# Patient Record
Sex: Female | Born: 1980 | Race: White | Hispanic: No | Smoking: Current every day smoker
Health system: Southern US, Community
[De-identification: ages and names within clinical notes are randomized; demographics above are authoritative.]

## PROBLEM LIST (undated history)

## (undated) ENCOUNTER — Inpatient Hospital Stay (HOSPITAL_COMMUNITY): Payer: Self-pay

## (undated) DIAGNOSIS — F1110 Opioid abuse, uncomplicated: Secondary | ICD-10-CM

## (undated) DIAGNOSIS — R87629 Unspecified abnormal cytological findings in specimens from vagina: Secondary | ICD-10-CM

## (undated) HISTORY — DX: Unspecified abnormal cytological findings in specimens from vagina: R87.629

---
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
## 2002-09-07 ENCOUNTER — Other Ambulatory Visit: Admission: RE | Admit: 2002-09-07 | Discharge: 2002-09-07 | Payer: Self-pay | Admitting: Family Medicine

## 2002-09-07 ENCOUNTER — Encounter: Payer: Self-pay | Admitting: Family Medicine

## 2002-09-07 ENCOUNTER — Ambulatory Visit (HOSPITAL_COMMUNITY): Admission: RE | Admit: 2002-09-07 | Discharge: 2002-09-07 | Payer: Self-pay | Admitting: Family Medicine

## 2002-10-23 ENCOUNTER — Ambulatory Visit (HOSPITAL_COMMUNITY): Admission: RE | Admit: 2002-10-23 | Discharge: 2002-10-23 | Payer: Self-pay | Admitting: Family Medicine

## 2002-10-26 ENCOUNTER — Encounter: Payer: Self-pay | Admitting: Family Medicine

## 2003-01-26 ENCOUNTER — Ambulatory Visit (HOSPITAL_COMMUNITY): Admission: RE | Admit: 2003-01-26 | Discharge: 2003-01-26 | Payer: Self-pay | Admitting: Family Medicine

## 2003-01-26 ENCOUNTER — Encounter: Payer: Self-pay | Admitting: Family Medicine

## 2003-03-20 ENCOUNTER — Inpatient Hospital Stay (HOSPITAL_COMMUNITY): Admission: AD | Admit: 2003-03-20 | Discharge: 2003-03-22 | Payer: Self-pay | Admitting: *Deleted

## 2003-12-24 ENCOUNTER — Other Ambulatory Visit: Admission: RE | Admit: 2003-12-24 | Discharge: 2003-12-24 | Payer: Self-pay | Admitting: Family Medicine

## 2016-03-07 ENCOUNTER — Encounter (HOSPITAL_COMMUNITY): Payer: Self-pay | Admitting: *Deleted

## 2016-03-07 ENCOUNTER — Emergency Department (HOSPITAL_COMMUNITY)
Admission: EM | Admit: 2016-03-07 | Discharge: 2016-03-07 | Disposition: A | Discharge status: Home / Self Care | Payer: Self-pay | Attending: Dermatology | Admitting: Dermatology

## 2016-03-07 DIAGNOSIS — Y999 Unspecified external cause status: Secondary | ICD-10-CM | POA: Insufficient documentation

## 2016-03-07 DIAGNOSIS — S90861A Insect bite (nonvenomous), right foot, initial encounter: Secondary | ICD-10-CM | POA: Insufficient documentation

## 2016-03-07 DIAGNOSIS — Y929 Unspecified place or not applicable: Secondary | ICD-10-CM | POA: Insufficient documentation

## 2016-03-07 DIAGNOSIS — F17210 Nicotine dependence, cigarettes, uncomplicated: Secondary | ICD-10-CM | POA: Insufficient documentation

## 2016-03-07 DIAGNOSIS — Y939 Activity, unspecified: Secondary | ICD-10-CM | POA: Insufficient documentation

## 2016-03-07 DIAGNOSIS — Z5321 Procedure and treatment not carried out due to patient leaving prior to being seen by health care provider: Secondary | ICD-10-CM | POA: Insufficient documentation

## 2016-03-07 DIAGNOSIS — W57XXXA Bitten or stung by nonvenomous insect and other nonvenomous arthropods, initial encounter: Secondary | ICD-10-CM | POA: Insufficient documentation

## 2016-03-07 NOTE — ED Notes (Addendum)
Pt comes in with redness, swelling, and warmth noted to her left foot. Area is noted to have a blister on it as well. Pt states she had a "black bug" bite her on Thursday.

## 2016-03-07 NOTE — ED Notes (Signed)
Pt told registration she was leaving.  

## 2018-06-07 ENCOUNTER — Other Ambulatory Visit: Payer: Self-pay

## 2018-06-07 ENCOUNTER — Encounter (HOSPITAL_COMMUNITY): Payer: Self-pay | Admitting: Emergency Medicine

## 2018-06-07 ENCOUNTER — Emergency Department (HOSPITAL_COMMUNITY)
Admission: EM | Admit: 2018-06-07 | Discharge: 2018-06-07 | Disposition: A | Discharge status: Home / Self Care | Payer: Medicaid Other | Attending: Emergency Medicine | Admitting: Emergency Medicine

## 2018-06-07 DIAGNOSIS — Z3A10 10 weeks gestation of pregnancy: Secondary | ICD-10-CM | POA: Insufficient documentation

## 2018-06-07 DIAGNOSIS — F17210 Nicotine dependence, cigarettes, uncomplicated: Secondary | ICD-10-CM | POA: Diagnosis not present

## 2018-06-07 DIAGNOSIS — O99331 Smoking (tobacco) complicating pregnancy, first trimester: Secondary | ICD-10-CM | POA: Insufficient documentation

## 2018-06-07 DIAGNOSIS — Z3201 Encounter for pregnancy test, result positive: Secondary | ICD-10-CM | POA: Diagnosis not present

## 2018-06-07 DIAGNOSIS — Z3200 Encounter for pregnancy test, result unknown: Secondary | ICD-10-CM | POA: Diagnosis present

## 2018-06-07 LAB — POC URINE PREG, ED: Preg Test, Ur: POSITIVE — AB

## 2018-06-07 MED ORDER — DOXYLAMINE SUCCINATE (SLEEP) 25 MG PO TABS: 25.0000 mg | ORAL_TABLET | Freq: Every evening | ORAL | 0 refills | Status: DC | PRN

## 2018-06-07 MED ORDER — PRENATAL VITAMIN 27-0.8 MG PO TABS: 1.0000 | ORAL_TABLET | Freq: Every day | ORAL | 3 refills | Status: DC

## 2018-06-07 MED ORDER — VITAMIN B-6 25 MG PO TABS: ORAL_TABLET | ORAL | 0 refills | Status: DC

## 2018-06-07 NOTE — ED Triage Notes (Signed)
Pt wants to be tested for possible pregnancy.

## 2018-06-08 NOTE — ED Provider Notes (Signed)
[facility] EMERGENCY DEPARTMENT Provider Note   CSN: [id number] Arrival date & time: 06/07/18  1754     History   Chief Complaint Chief Complaint  Patient presents with  . Possible Pregnancy    HPI Tracy Stevenson is a 37 y.o. female  Presenting for confirmation of suspected pregnancy.  She reports her LMP was around the last week of July and normal. She has had increased intermittent nausea, reporting some days are better than others, had a full meal of chili and fries prior to arrival without n/v today.  She denies abdominal or pelvic pain, no dysuria, hematuria, vaginal discharge or other complaint.  She took a home pregnancy test which was positive and states she needs confirmation so she can apply for medicaid. She also is desirous of referral for prenatal care.  The history is provided by the patient.    History reviewed. No pertinent past medical history.  There are no active problems to display for this patient.   History reviewed. No pertinent surgical history.   OB History   None      Home Medications    Prior to Admission medications   Medication Sig Start Date End Date Taking? Authorizing Provider  doxylamine, Sleep, (UNISOM) 25 MG tablet Take 1 tablet (25 mg total) by mouth at bedtime as needed (morning sickness). Take 1 tablet by mouth before bedtime and another 1/2 tablet in the morning if needed for persistent nausea. 06/07/18   Burgess Amor, PA-C  Prenatal Vit-Fe Fumarate-FA (PRENATAL VITAMIN) 27-0.8 MG TABS Take 1 tablet by mouth daily. 06/07/18   Burgess Amor, PA-C  vitamin B-6 (PYRIDOXINE) 25 MG tablet Take 1 tablet by mouth before bedtime and another 1/2 tablet in the morning if needed for persistent nausea. 06/07/18   Burgess Amor, PA-C    Family History No family history on file.  Social History Social History   Tobacco Use  . Smoking status: Current Every Day Smoker    Packs/day: 0.50    Types: Cigarettes  . Smokeless tobacco: Never Used    Substance Use Topics  . Alcohol use: No  . Drug use: Never     Allergies   Patient has no known allergies.   Review of Systems Review of Systems  Constitutional: Negative for fever.  HENT: Negative for congestion and sore throat.   Eyes: Negative.   Respiratory: Negative for chest tightness and shortness of breath.   Cardiovascular: Negative for chest pain.  Gastrointestinal: Positive for nausea and vomiting. Negative for abdominal pain, constipation and diarrhea.  Genitourinary: Negative.  Negative for dysuria, hematuria, pelvic pain, vaginal bleeding, vaginal discharge and vaginal pain.  Musculoskeletal: Negative for arthralgias, joint swelling and neck pain.  Skin: Negative.  Negative for rash and wound.  Neurological: Negative for dizziness, weakness, light-headedness, numbness and headaches.  Psychiatric/Behavioral: Negative.      Physical Exam Updated Vital Signs BP 110/64   Pulse 70   Temp 98.3 F (36.8 C) (Oral)   Resp 15   Ht 5\' 8"  (1.727 m)   Wt 68 kg   SpO2 100%   BMI 22.81 kg/m   Physical Exam  Constitutional: She appears well-developed and well-nourished.  HENT:  Head: Normocephalic and atraumatic.  Eyes: Conjunctivae are normal.  Neck: Normal range of motion.  Cardiovascular: Normal rate, regular rhythm, normal heart sounds and intact distal pulses.  Pulmonary/Chest: Effort normal and breath sounds normal. She has no wheezes.  Abdominal: Soft. Bowel sounds are normal. There is no tenderness.  Genitourinary:  Genitourinary Comments: deferred  Musculoskeletal: Normal range of motion.  Neurological: She is alert.  Skin: Skin is warm and dry.  Psychiatric: She has a normal mood and affect.  Nursing note and vitals reviewed.    ED Treatments / Results  Labs (all labs ordered are listed, but only abnormal results are displayed) Labs Reviewed  POC URINE PREG, ED - Abnormal; Notable for the following components:      Result Value   Preg Test, Ur  POSITIVE (*)    All other components within normal limits    EKG None  Radiology No results found.  Procedures Procedures (including critical care time)  Medications Ordered in ED Medications - No data to display   Initial Impression / Assessment and Plan / ED Course  I have reviewed the triage vital signs and the nursing notes.  Pertinent labs & imaging results that were available during my care of the patient were reviewed by me and considered in my medical decision making (see chart for details).     Pt with positive pregnancy test, per LMP, gestational age approx 10 weeks.  She has no sx other than intermittent nausea, no evidence for dehydration, hx not suggesting hyperemesis gravidarum. No indication for further testing or labs today, but she was given referral to establish prenatal care.  Also, prescribed prenatal vitamin and prescribed vit B6 and doxylamine as pt would not be able to afford the diclegis combo.  Prn f/u anticipated.  The patient appears reasonably screened and/or stabilized for discharge and I doubt any other medical condition or other [facility] requiring further screening, evaluation, or treatment in the ED at this time prior to discharge.   Final Clinical Impressions(s) / ED Diagnoses   Final diagnoses:  Positive pregnancy test    ED Discharge Orders         Ordered    vitamin B-6 (PYRIDOXINE) 25 MG tablet     06/07/18 1934    doxylamine, Sleep, (UNISOM) 25 MG tablet  At bedtime PRN     06/07/18 1934    Prenatal Vit-Fe Fumarate-FA (PRENATAL VITAMIN) 27-0.8 MG TABS  Daily     06/07/18 1934           Burgess Amor, PA-C 06/08/18 1328    Samuel Jester, DO 06/09/18 1422

## 2018-06-15 ENCOUNTER — Other Ambulatory Visit: Payer: Self-pay | Admitting: Obstetrics and Gynecology

## 2018-06-15 DIAGNOSIS — O3680X0 Pregnancy with inconclusive fetal viability, not applicable or unspecified: Secondary | ICD-10-CM

## 2018-06-16 ENCOUNTER — Other Ambulatory Visit: Payer: Self-pay | Admitting: Obstetrics and Gynecology

## 2018-06-16 ENCOUNTER — Other Ambulatory Visit: Payer: Medicaid Other

## 2018-06-16 ENCOUNTER — Ambulatory Visit (INDEPENDENT_AMBULATORY_CARE_PROVIDER_SITE_OTHER): Payer: Medicaid Other

## 2018-06-16 DIAGNOSIS — Z3682 Encounter for antenatal screening for nuchal translucency: Secondary | ICD-10-CM | POA: Diagnosis not present

## 2018-06-16 DIAGNOSIS — O0990 Supervision of high risk pregnancy, unspecified, unspecified trimester: Secondary | ICD-10-CM | POA: Insufficient documentation

## 2018-06-16 DIAGNOSIS — Z3482 Encounter for supervision of other normal pregnancy, second trimester: Secondary | ICD-10-CM

## 2018-06-16 DIAGNOSIS — Z1379 Encounter for other screening for genetic and chromosomal anomalies: Secondary | ICD-10-CM

## 2018-06-16 DIAGNOSIS — O3680X0 Pregnancy with inconclusive fetal viability, not applicable or unspecified: Secondary | ICD-10-CM

## 2018-06-16 NOTE — Progress Notes (Signed)
[place] 13+4 wks,single IUP,CRL 75.78 mm,normal ovaries bilat,NB present,NT 1.3 mm,fhr 144 bpm

## 2018-06-17 ENCOUNTER — Encounter: Payer: Self-pay | Admitting: Women's Health

## 2018-06-17 DIAGNOSIS — O9989 Other specified diseases and conditions complicating pregnancy, childbirth and the puerperium: Secondary | ICD-10-CM

## 2018-06-17 DIAGNOSIS — O09899 Supervision of other high risk pregnancies, unspecified trimester: Secondary | ICD-10-CM | POA: Insufficient documentation

## 2018-06-17 DIAGNOSIS — Z283 Underimmunization status: Secondary | ICD-10-CM | POA: Insufficient documentation

## 2018-06-19 LAB — OBSTETRIC PANEL, INCLUDING HIV
ANTIBODY SCREEN: NEGATIVE
BASOS ABS: 0 10*3/uL (ref 0.0–0.2)
Basos: 1 %
EOS (ABSOLUTE): 0.1 10*3/uL (ref 0.0–0.4)
EOS: 1 %
HIV SCREEN 4TH GENERATION: NONREACTIVE
Hematocrit: 35.6 % (ref 34.0–46.6)
Hemoglobin: 11.7 g/dL (ref 11.1–15.9)
Hepatitis B Surface Ag: NEGATIVE
IMMATURE GRANULOCYTES: 0 %
Immature Grans (Abs): 0 10*3/uL (ref 0.0–0.1)
LYMPHS ABS: 1.4 10*3/uL (ref 0.7–3.1)
Lymphs: 24 %
MCH: 28.2 pg (ref 26.6–33.0)
MCHC: 32.9 g/dL (ref 31.5–35.7)
MCV: 86 fL (ref 79–97)
MONOS ABS: 0.4 10*3/uL (ref 0.1–0.9)
Monocytes: 8 %
NEUTROS PCT: 66 %
Neutrophils Absolute: 3.8 10*3/uL (ref 1.4–7.0)
PLATELETS: 234 10*3/uL (ref 150–450)
RBC: 4.15 x10E6/uL (ref 3.77–5.28)
RDW: 12.1 % — ABNORMAL LOW (ref 12.3–15.4)
RH TYPE: POSITIVE
RPR Ser Ql: NONREACTIVE
Rubella Antibodies, IGG: <0.9 index — ABNORMAL LOW (ref >0.99)
WBC: 5.7 10*3/uL (ref 3.4–10.8)

## 2018-06-19 LAB — INTEGRATED 1
Crown Rump Length: 75.8 mm
GEST. AGE ON COLLECTION DATE: 13.4 wk
Maternal Age at EDD: 35.7 yr
NUCHAL TRANSLUCENCY (NT): 1.3 mm
NUMBER OF FETUSES: 1
PAPP-A Value: 1239.8 ng/mL
Weight: 154 [lb_av]

## 2018-07-05 ENCOUNTER — Ambulatory Visit: Payer: Medicaid Other | Admitting: *Deleted

## 2018-07-05 ENCOUNTER — Encounter: Payer: Medicaid Other | Admitting: Advanced Practice Midwife

## 2018-07-19 ENCOUNTER — Encounter (INDEPENDENT_AMBULATORY_CARE_PROVIDER_SITE_OTHER): Payer: Self-pay

## 2018-07-19 ENCOUNTER — Other Ambulatory Visit (HOSPITAL_COMMUNITY)
Admission: RE | Admit: 2018-07-19 | Discharge: 2018-07-19 | Disposition: A | Discharge status: Home / Self Care | Payer: Medicaid Other | Source: Ambulatory Visit | Attending: Advanced Practice Midwife | Admitting: Advanced Practice Midwife

## 2018-07-19 ENCOUNTER — Encounter: Payer: Self-pay | Admitting: Advanced Practice Midwife

## 2018-07-19 ENCOUNTER — Ambulatory Visit (INDEPENDENT_AMBULATORY_CARE_PROVIDER_SITE_OTHER): Payer: Medicaid Other | Admitting: Advanced Practice Midwife

## 2018-07-19 ENCOUNTER — Ambulatory Visit: Payer: Medicaid Other | Admitting: *Deleted

## 2018-07-19 VITALS — BP 122/70 | HR 78 | Wt 153.0 lb

## 2018-07-19 DIAGNOSIS — Z331 Pregnant state, incidental: Secondary | ICD-10-CM

## 2018-07-19 DIAGNOSIS — Z1389 Encounter for screening for other disorder: Secondary | ICD-10-CM

## 2018-07-19 DIAGNOSIS — Z3A18 18 weeks gestation of pregnancy: Secondary | ICD-10-CM | POA: Insufficient documentation

## 2018-07-19 DIAGNOSIS — Z23 Encounter for immunization: Secondary | ICD-10-CM | POA: Diagnosis not present

## 2018-07-19 DIAGNOSIS — Z3482 Encounter for supervision of other normal pregnancy, second trimester: Secondary | ICD-10-CM | POA: Diagnosis present

## 2018-07-19 DIAGNOSIS — F1129 Opioid dependence with unspecified opioid-induced disorder: Secondary | ICD-10-CM

## 2018-07-19 DIAGNOSIS — Z363 Encounter for antenatal screening for malformations: Secondary | ICD-10-CM

## 2018-07-19 DIAGNOSIS — F1120 Opioid dependence, uncomplicated: Secondary | ICD-10-CM | POA: Insufficient documentation

## 2018-07-19 DIAGNOSIS — Z1379 Encounter for other screening for genetic and chromosomal anomalies: Secondary | ICD-10-CM

## 2018-07-19 DIAGNOSIS — F17200 Nicotine dependence, unspecified, uncomplicated: Secondary | ICD-10-CM | POA: Insufficient documentation

## 2018-07-19 LAB — POCT URINALYSIS DIPSTICK OB
Blood, UA: NEGATIVE
GLUCOSE, UA: NEGATIVE
Ketones, UA: NEGATIVE
LEUKOCYTES UA: NEGATIVE
NITRITE UA: NEGATIVE
POC,PROTEIN,UA: NEGATIVE

## 2018-07-19 NOTE — Progress Notes (Signed)
INITIAL OBSTETRICAL VISIT Patient name: Tracy Stevenson MRN [id number]  Date of birth: 13-Jul-1981 Chief Complaint:   Initial Prenatal Visit  History of Present Illness:   Tracy Stevenson is a 37 y.o. G8P1001 Caucasian female at [redacted]w[redacted]d by 2nd trimester [place] with an Estimated Date of Delivery: 12/18/18 being seen today for her initial obstetrical visit.   Her obstetrical history is significant for term SVD w/o problems 15 years ago.   Today she reports no complaints. She has an opioid addiction for 2-3 years . Has never used IV drugs.  Has been using ex's suboxone (spliting apart 8 mg films) for about a year, but is now nearly out. FOB (who is not her ex) doesn't know about any of this.  No LMP recorded. Patient is pregnant. Last pap 5 years ago. Results were: normal Review of Systems:   Pertinent items are noted in HPI Denies cramping/contractions, leakage of fluid, vaginal bleeding, abnormal vaginal discharge w/ itching/odor/irritation, headaches, visual changes, shortness of breath, chest pain, abdominal pain, severe nausea/vomiting, or problems with urination or bowel movements unless otherwise stated above.  Pertinent History Reviewed:  Reviewed past medical,surgical, social, obstetrical and family history.  Reviewed problem list, medications and allergies. OB History  Gravida Para Term Preterm AB Living  2 1 1     1   SAB TAB Ectopic Multiple Live Births          1    # Outcome Date GA Lbr Len/2nd Weight Sex Delivery Anes PTL Lv  2 Current           1 Term 03/21/03 [redacted]w[redacted]d  7 lb 5 oz (3.317 kg) M Vag-Spont EPI N LIV   Physical Assessment:   Vitals:   07/19/18 1054  BP: 122/70  Pulse: 78  Weight: 153 lb (69.4 kg)  Body mass index is 23.26 kg/m.       Physical Examination:  General appearance - well appearing, and in no distress  Mental status - alert, oriented to person, place, and time  Psych:  She has a normal mood and affect  Skin - warm and dry, normal color, no suspicious  lesions noted  Chest - effort normal, all lung fields clear to auscultation bilaterally  Heart - normal rate and regular rhythm  Abdomen - soft, nontender  Extremities:  No swelling or varicosities noted   Pelvic - VULVA: normal appearing vulva with no masses, tenderness or lesions  VAGINA: normal appearing vagina with normal color and discharge, no lesions  CERVIX: normal appearing cervix without discharge or lesions, no CMT  Thin prep pap is done with HR HPV cotesting      Results for orders placed or performed in visit on 07/19/18 (from the past 24 hour(s))  POC Urinalysis Dipstick OB   Collection Time: 07/19/18 11:18 AM  Result Value Ref Range   Color, UA     Clarity, UA     Glucose, UA Negative Negative   Bilirubin, UA     Ketones, UA neg    Spec Grav, UA     Blood, UA neg    pH, UA     POC,PROTEIN,UA Negative Negative, Trace, Small (1+), Moderate (2+), Large (3+), 4+   Urobilinogen, UA     Nitrite, UA neg    Leukocytes, UA Negative Negative   Appearance     Odor      Assessment & Plan:  1) Low-Risk Pregnancy G2P1001 at [redacted]w[redacted]d with an Estimated Date of Delivery: 12/18/18   2) Initial OB  visit  3) Opioid addiction:  [facility] at [facility] am  for intake to establish care.   Meds: No orders of the defined types were placed in this encounter.   Initial labs obtained Continue prenatal vitamins Reviewed n/v relief measures and warning s/s to report Reviewed recommended weight gain based on pre-gravid BMI Encouraged well-balanced diet Genetic Screening discussed Integrated Screen: get 2nd part today Cystic fibrosis screening discussed requested Ultrasound discussed; fetal survey: requested CCNC completed  Follow-up: Return for ASAP for anaotmy scan only; 4 weeks for LROB.   Orders Placed This Encounter  Procedures  . Urine Culture  . US OB Comp + 14 Wk  . Flu Vaccine QUAD 36+ mos IM  . Urinalysis, Routine w reflex microscopic  .  Obstetric Panel, Including HIV  . Pain Management Screening Profile (10S)  . INTEGRATED 2  . Cystic Fibrosis Mutation 97  . POC Urinalysis Dipstick OB    Jacklyn ShellFrances Cresenzo-Dishmon DNP, CNM 07/19/2018 12:06 PM

## 2018-07-19 NOTE — Patient Instructions (Addendum)
[facility]  [address] [place], [place] [address] Phone: [phone]  Appt 11/20 at 10 am       BODY CHANGES Your body goes through many changes during pregnancy. The changes vary from woman to woman.   You may gain or lose a couple of pounds at first.  You may feel sick to your stomach (nauseous) and throw up (vomit). If the vomiting is uncontrollable, call your health care provider.  You may tire easily.  You may develop headaches that can be relieved by medicines approved by your health care provider.  You may urinate more often. Painful urination may mean you have a bladder infection.  You may develop heartburn as a result of your pregnancy.  You may develop constipation because certain hormones are causing the muscles that push waste through your intestines to slow down.  You may develop hemorrhoids or swollen, bulging veins (varicose veins).  Your breasts may begin to grow larger and become tender. Your nipples may stick out more, and the tissue that surrounds them (areola) may become darker.  Your gums may bleed and may be sensitive to brushing and flossing.  Dark spots or blotches (chloasma, mask of pregnancy) may develop on your face. This will likely fade after the baby is born.  Your menstrual periods will stop.  You may have a loss of appetite.  You may develop cravings for certain kinds of food.  You may have changes in your emotions from day to day, such as being excited to be pregnant or being concerned that something may go wrong with the pregnancy and baby.  You may have more vivid and strange dreams.  You may have changes in your hair. These can include thickening of your hair, rapid growth, and changes in texture. Some women also have hair loss during or after pregnancy, or hair that feels dry or thin. Your hair will most likely return to normal after your baby is born. WHAT TO EXPECT AT YOUR PRENATAL VISITS During a  routine prenatal visit:  You will be weighed to make sure you and the baby are growing normally.  Your blood pressure will be taken.  Your abdomen will be measured to track your baby's growth.  The fetal heartbeat will be listened to starting around week 10 or 12 of your pregnancy.  Test results from any previous visits will be discussed. Your health care provider may ask you:  How you are feeling.  If you are feeling the baby move.  If you have had any abnormal symptoms, such as leaking fluid, bleeding, severe headaches, or abdominal cramping.  If you have any questions. Other tests that may be performed during your first trimester include:  Blood tests to find your blood type and to check for the presence of any previous infections. They will also be used to check for low iron levels (anemia) and Rh antibodies. Later in the pregnancy, blood tests for diabetes will be done along with other tests if problems develop.  Urine tests to check for infections, diabetes, or protein in the urine.  An ultrasound to confirm the proper growth and development of the baby.  An amniocentesis to check for possible genetic problems.  Fetal screens for spina bifida and Down syndrome.  You may need other tests to make sure you and the baby are doing well. HOME CARE INSTRUCTIONS  Medicines  Follow your health care provider's instructions regarding medicine use. Specific medicines may be either safe or  unsafe to take during pregnancy.  Take your prenatal vitamins as directed.  If you develop constipation, try taking a stool softener if your health care provider approves. Diet  Eat regular, well-balanced meals. Choose a variety of foods, such as meat or vegetable-based protein, fish, milk and low-fat dairy products, vegetables, fruits, and whole grain breads and cereals. Your health care provider will help you determine the amount of weight gain that is right for you.  Avoid raw meat and  uncooked cheese. These carry germs that can cause birth defects in the baby.  Eating four or five small meals rather than three large meals a day may help relieve nausea and vomiting. If you start to feel nauseous, eating a few soda crackers can be helpful. Drinking liquids between meals instead of during meals also seems to help nausea and vomiting.  If you develop constipation, eat more high-fiber foods, such as fresh vegetables or fruit and whole grains. Drink enough fluids to keep your urine clear or pale yellow. Activity and Exercise  Exercise only as directed by your health care provider. Exercising will help you:  Control your weight.  Stay in shape.  Be prepared for labor and delivery.  Experiencing pain or cramping in the lower abdomen or low back is a good sign that you should stop exercising. Check with your health care provider before continuing normal exercises.  Try to avoid standing for long periods of time. Move your legs often if you must stand in one place for a long time.  Avoid heavy lifting.  Wear low-heeled shoes, and practice good posture.  You may continue to have sex unless your health care provider directs you otherwise. Relief of Pain or Discomfort  Wear a good support bra for breast tenderness.   Take warm sitz baths to soothe any pain or discomfort caused by hemorrhoids. Use hemorrhoid cream if your health care provider approves.   Rest with your legs elevated if you have leg cramps or low back pain.  If you develop varicose veins in your legs, wear support hose. Elevate your feet for 15 minutes, 3-4 times a day. Limit salt in your diet. Prenatal Care  Schedule your prenatal visits by the twelfth week of pregnancy. They are usually scheduled monthly at first, then more often in the last 2 months before delivery.  Write down your questions. Take them to your prenatal visits.  Keep all your prenatal visits as directed by your health care  provider. Safety  Wear your seat belt at all times when driving.  Make a list of emergency phone numbers, including numbers for family, friends, the hospital, and police and fire departments. General Tips  Ask your health care provider for a referral to a local prenatal education class. Begin classes no later than at the beginning of month 6 of your pregnancy.  Ask for help if you have counseling or nutritional needs during pregnancy. Your health care provider can offer advice or refer you to specialists for help with various needs.  Do not use hot tubs, steam rooms, or saunas.  Do not douche or use tampons or scented sanitary pads.  Do not cross your legs for long periods of time.  Avoid cat litter boxes and soil used by cats. These carry germs that can cause birth defects in the baby and possibly loss of the fetus by miscarriage or stillbirth.  Avoid all smoking, herbs, alcohol, and medicines not prescribed by your health care provider. Chemicals in these affect the  formation and growth of the baby.  Schedule a dentist appointment. At home, brush your teeth with a soft toothbrush and be gentle when you floss. SEEK MEDICAL CARE IF:   You have dizziness.  You have mild pelvic cramps, pelvic pressure, or nagging pain in the abdominal area.  You have persistent nausea, vomiting, or diarrhea.  You have a bad smelling vaginal discharge.  You have pain with urination.  You notice increased swelling in your face, hands, legs, or ankles. SEEK IMMEDIATE MEDICAL CARE IF:   You have a fever.  You are leaking fluid from your vagina.  You have spotting or bleeding from your vagina.  You have severe abdominal cramping or pain.  You have rapid weight gain or loss.  You vomit blood or material that looks like coffee grounds.  You are exposed to [place] measles and have never had them.  You are exposed to fifth disease or chickenpox.  You develop a severe headache.  You have  shortness of breath.  You have any kind of trauma, such as from a fall or a car accident. Document Released: 08/11/2001 Document Revised: 01/01/2014 Document Reviewed: 06/27/2013 [facility] Patient Information 2015 [place], [place]. This information is not intended to replace advice given to you by your health care provider. Make sure you discuss any questions you have with your health care provider.   Nausea & Vomiting  Have saltine crackers or pretzels by your bed and eat a few bites before you raise your head out of bed in the morning  Eat small frequent meals throughout the day instead of large meals  Drink plenty of fluids throughout the day to stay hydrated, just don't drink a lot of fluids with your meals.  This can make your stomach fill up faster making you feel sick  Do not brush your teeth right after you eat  Products with real ginger are good for nausea, like ginger ale and ginger hard candy Make sure it says made with real ginger!  Sucking on sour candy like lemon heads is also good for nausea  If your prenatal vitamins make you nauseated, take them at night so you will sleep through the nausea  Sea Bands  If you feel like you need medicine for the nausea & vomiting please let us know  If you are unable to keep any fluids or food down please let us know   Constipation  Drink plenty of fluid, preferably water, throughout the day  Eat foods high in fiber such as fruits, vegetables, and grains  Exercise, such as walking, is a good way to keep your bowels regular  Drink warm fluids, especially warm prune juice, or decaf coffee  Eat a 1/2 cup of real oatmeal (not instant), 1/2 cup applesauce, and 1/2-1 cup warm prune juice every day  If needed, you may take Colace (docusate sodium) stool softener once or twice a day to help keep the stool soft. If you are pregnant, wait until you are out of your first trimester (12-14 weeks of pregnancy)  If you still are having  problems with constipation, you may take Miralax once daily as needed to help keep your bowels regular.  If you are pregnant, wait until you are out of your first trimester (12-14 weeks of pregnancy)  Safe Medications in Pregnancy   Acne: Benzoyl Peroxide Salicylic Acid  Backache/Headache: Tylenol: 2 regular strength every 4 hours OR              2 Extra  strength every 6 hours  Colds/Coughs/Allergies: Benadryl (alcohol free) 25 mg every 6 hours as needed Breath right strips Claritin Cepacol throat lozenges Chloraseptic throat spray Cold-Eeze- up to three times per day Cough drops, alcohol free Flonase (by prescription only) Guaifenesin Mucinex Robitussin DM (plain only, alcohol free) Saline nasal spray/drops Sudafed (pseudoephedrine) & Actifed ** use only after [redacted] weeks gestation and if you do not have high blood pressure Tylenol Vicks Vaporub Zinc lozenges Zyrtec   Constipation: Colace Ducolax suppositories Fleet enema Glycerin suppositories Metamucil Milk of magnesia Miralax Senokot Smooth move tea  Diarrhea: Kaopectate Imodium A-D  *NO pepto Bismol  Hemorrhoids: Anusol Anusol HC Preparation H Tucks  Indigestion: Tums Maalox Mylanta Zantac  Pepcid  Insomnia: Benadryl (alcohol free) 25mg  every 6 hours as needed Tylenol PM Unisom, no Gelcaps  Leg Cramps: Tums MagGel  Nausea/Vomiting:  Bonine Dramamine Emetrol Ginger extract Sea bands Meclizine  Nausea medication to take during pregnancy:  Unisom (doxylamine succinate 25 mg tablets) Take one tablet daily at bedtime. If symptoms are not adequately controlled, the dose can be increased to a maximum recommended dose of two tablets daily (1/2 tablet in the morning, 1/2 tablet mid-afternoon and one at bedtime). Vitamin B6 100mg  tablets. Take one tablet twice a day (up to 200 mg per day).  Skin Rashes: Aveeno products Benadryl cream or 25mg  every 6 hours as needed Calamine Lotion 1%  cortisone cream  Yeast infection: Gyne-lotrimin 7 Monistat 7   **If taking multiple medications, please check labels to avoid duplicating the same active ingredients **take medication as directed on the label ** Do not exceed 4000 mg of tylenol in 24 hours **Do not take medications that contain aspirin or ibuprofen

## 2018-07-20 LAB — PMP SCREEN PROFILE (10S), URINE
AMPHETAMINE SCREEN URINE: NEGATIVE ng/mL
BARBITURATE SCREEN URINE: NEGATIVE ng/mL
BENZODIAZEPINE SCREEN, URINE: NEGATIVE ng/mL
CANNABINOIDS UR QL SCN: POSITIVE ng/mL — AB
Cocaine (Metab) Scrn, Ur: NEGATIVE ng/mL
Creatinine(Crt), U: 141.9 mg/dL (ref 20.0–300.0)
METHADONE SCREEN, URINE: NEGATIVE ng/mL
OXYCODONE+OXYMORPHONE UR QL SCN: NEGATIVE ng/mL
Opiate Scrn, Ur: NEGATIVE ng/mL
PHENCYCLIDINE QUANTITATIVE URINE: NEGATIVE ng/mL
PROPOXYPHENE SCREEN URINE: NEGATIVE ng/mL
Ph of Urine: 7.6 (ref 4.5–8.9)

## 2018-07-20 LAB — OBSTETRIC PANEL, INCLUDING HIV
Antibody Screen: NEGATIVE
BASOS ABS: 0 10*3/uL (ref 0.0–0.2)
Basos: 1 %
EOS (ABSOLUTE): 0.1 10*3/uL (ref 0.0–0.4)
Eos: 1 %
HIV SCREEN 4TH GENERATION: NONREACTIVE
Hematocrit: 34.8 % (ref 34.0–46.6)
Hemoglobin: 11.7 g/dL (ref 11.1–15.9)
Hepatitis B Surface Ag: NEGATIVE
IMMATURE GRANS (ABS): 0 10*3/uL (ref 0.0–0.1)
Immature Granulocytes: 0 %
LYMPHS: 17 %
Lymphocytes Absolute: 1.3 10*3/uL (ref 0.7–3.1)
MCH: 28 pg (ref 26.6–33.0)
MCHC: 33.6 g/dL (ref 31.5–35.7)
MCV: 83 fL (ref 79–97)
MONOCYTES: 5 %
Monocytes Absolute: 0.4 10*3/uL (ref 0.1–0.9)
NEUTROS ABS: 6 10*3/uL (ref 1.4–7.0)
Neutrophils: 76 %
PLATELETS: 264 10*3/uL (ref 150–450)
RBC: 4.18 x10E6/uL (ref 3.77–5.28)
RDW: 11.8 % — AB (ref 12.3–15.4)
RPR Ser Ql: NONREACTIVE
Rh Factor: POSITIVE
WBC: 7.9 10*3/uL (ref 3.4–10.8)

## 2018-07-20 LAB — URINALYSIS, ROUTINE W REFLEX MICROSCOPIC
BILIRUBIN UA: NEGATIVE
Glucose, UA: NEGATIVE
KETONES UA: NEGATIVE
Nitrite, UA: NEGATIVE
PROTEIN UA: NEGATIVE
RBC UA: NEGATIVE
SPEC GRAV UA: 1.024 (ref 1.005–1.030)
Urobilinogen, Ur: 0.2 mg/dL (ref 0.2–1.0)
pH, UA: 7.5 (ref 5.0–7.5)

## 2018-07-20 LAB — MICROSCOPIC EXAMINATION: Casts: NONE SEEN /lpf

## 2018-07-21 ENCOUNTER — Other Ambulatory Visit: Payer: Self-pay | Admitting: Advanced Practice Midwife

## 2018-07-21 LAB — INTEGRATED 2
AFP MoM: 0.9
Alpha-Fetoprotein: 36.9 ng/mL
CROWN RUMP LENGTH: 75.8 mm
DIA MOM: 1.07
DIA VALUE: 175 pg/mL
Estriol, Unconjugated: 1.42 ng/mL
GEST. AGE ON COLLECTION DATE: 13.4 wk
GESTATIONAL AGE: 18.1 wk
HCG MOM: 1.09
Maternal Age at EDD: 35.7 yr
Nuchal Translucency (NT): 1.3 mm
Nuchal Translucency MoM: 0.74
Number of Fetuses: 1
PAPP-A MOM: 0.96
PAPP-A VALUE: 1239.8 ng/mL
TEST RESULTS: NEGATIVE
WEIGHT: 154 [lb_av]
Weight: 154 [lb_av]
hCG Value: 26.4 IU/mL
uE3 MoM: 1.05

## 2018-07-21 LAB — CYTOLOGY - PAP
CHLAMYDIA, DNA PROBE: NEGATIVE
DIAGNOSIS: NEGATIVE
HPV: NOT DETECTED
NEISSERIA GONORRHEA: NEGATIVE

## 2018-07-21 LAB — URINE CULTURE

## 2018-07-21 NOTE — Progress Notes (Unsigned)
Septra for e coli in urine

## 2018-07-22 ENCOUNTER — Other Ambulatory Visit: Payer: Self-pay | Admitting: Women's Health

## 2018-07-22 ENCOUNTER — Telehealth: Payer: Self-pay | Admitting: *Deleted

## 2018-07-22 ENCOUNTER — Ambulatory Visit (INDEPENDENT_AMBULATORY_CARE_PROVIDER_SITE_OTHER): Payer: Medicaid Other

## 2018-07-22 DIAGNOSIS — Z363 Encounter for antenatal screening for malformations: Secondary | ICD-10-CM

## 2018-07-22 DIAGNOSIS — F17200 Nicotine dependence, unspecified, uncomplicated: Secondary | ICD-10-CM

## 2018-07-22 MED ORDER — NITROFURANTOIN MONOHYD MACRO 100 MG PO CAPS: 100.0000 mg | ORAL_CAPSULE | Freq: Two times a day (BID) | ORAL | 0 refills | Status: DC

## 2018-07-22 NOTE — Telephone Encounter (Signed)
LMOVM. Informed pt that urine culture showed a UTI and an antibiotic would be sent to her pharmacy.

## 2018-07-22 NOTE — Progress Notes (Signed)
US 18+5 wks,transverse head right,cx 2.9 cm,posterior placenta gr 0,svp of fluid 5.9 cm,normal ovaries bilat,fhr 130 bpm,efw 274 g 68%,limited view of spine,please have pt come back for additional images,no obvious abnormalities

## 2018-07-25 LAB — CYSTIC FIBROSIS MUTATION 97: GENE DIS ANAL CARRIER INTERP BLD/T-IMP: NOT DETECTED

## 2018-07-26 NOTE — Telephone Encounter (Signed)
Pt already aware.

## 2018-08-15 ENCOUNTER — Other Ambulatory Visit: Payer: Self-pay | Admitting: Obstetrics & Gynecology

## 2018-08-15 DIAGNOSIS — IMO0002 Reserved for concepts with insufficient information to code with codable children: Secondary | ICD-10-CM

## 2018-08-15 DIAGNOSIS — Z0489 Encounter for examination and observation for other specified reasons: Secondary | ICD-10-CM

## 2018-08-16 ENCOUNTER — Other Ambulatory Visit: Payer: Self-pay

## 2018-08-16 ENCOUNTER — Ambulatory Visit (INDEPENDENT_AMBULATORY_CARE_PROVIDER_SITE_OTHER): Payer: Medicaid Other | Admitting: Advanced Practice Midwife

## 2018-08-16 ENCOUNTER — Ambulatory Visit (INDEPENDENT_AMBULATORY_CARE_PROVIDER_SITE_OTHER): Payer: Medicaid Other

## 2018-08-16 ENCOUNTER — Encounter: Payer: Self-pay | Admitting: Advanced Practice Midwife

## 2018-08-16 VITALS — BP 108/45 | HR 64 | Wt 157.0 lb

## 2018-08-16 DIAGNOSIS — Z3A22 22 weeks gestation of pregnancy: Secondary | ICD-10-CM

## 2018-08-16 DIAGNOSIS — IMO0002 Reserved for concepts with insufficient information to code with codable children: Secondary | ICD-10-CM

## 2018-08-16 DIAGNOSIS — Z0489 Encounter for examination and observation for other specified reasons: Secondary | ICD-10-CM

## 2018-08-16 DIAGNOSIS — Z331 Pregnant state, incidental: Secondary | ICD-10-CM

## 2018-08-16 DIAGNOSIS — Z3482 Encounter for supervision of other normal pregnancy, second trimester: Secondary | ICD-10-CM

## 2018-08-16 DIAGNOSIS — Z1389 Encounter for screening for other disorder: Secondary | ICD-10-CM

## 2018-08-16 DIAGNOSIS — F17200 Nicotine dependence, unspecified, uncomplicated: Secondary | ICD-10-CM

## 2018-08-16 DIAGNOSIS — Z87440 Personal history of urinary (tract) infections: Secondary | ICD-10-CM

## 2018-08-16 LAB — POCT URINALYSIS DIPSTICK OB
Blood, UA: NEGATIVE
GLUCOSE, UA: NEGATIVE
KETONES UA: NEGATIVE
Nitrite, UA: NEGATIVE
POC,PROTEIN,UA: NEGATIVE

## 2018-08-16 NOTE — Progress Notes (Signed)
  G2P1001 6061w2d Estimated Date of Delivery: 12/18/18  Blood pressure (!) 108/45, pulse 64, weight 157 lb (71.2 kg).   BP weight and urine results all reviewed and noted.  Please refer to the obstetrical flow sheet for the fundal height and fetal heart rate documentation:  Patient reports good fetal movement, denies any bleeding and no rupture of membranes symptoms or regular contractions. Patient is without complaints. All questions were answered.  On suboxone since last visit. Was on 2mg  qd.  Toruble sleeping, so upped it to 2 mg BID.    Physical Assessment:   Vitals:   08/16/18 1117  BP: (!) 108/45  Pulse: 64  Weight: 157 lb (71.2 kg)  Body mass index is 23.87 kg/m.        Physical Examination:   General appearance: Well appearing, and in no distress  Mental status: Alert, oriented to person, place, and time  Skin: Warm & dry  Cardiovascular: Normal heart rate noted  Respiratory: Normal respiratory effort, no distress  Abdomen: Soft, gravid, nontender  Pelvic: Cervical exam deferred         Extremities: Edema: None  Fetal Status:     Movement: Present   US 22+2 wks,cephalic,posterior placenta gr 0,cx 2.9 cm,anatomy of the spine complete,no obvious abnormalities,svp of fluid 6 cm,fhr 129 bpm,bilat adnexa's wnl,efw 562 g 81%  Results for orders placed or performed in visit on 08/16/18 (from the past 24 hour(s))  POC Urinalysis Dipstick OB   Collection Time: 08/16/18 11:18 AM  Result Value Ref Range   Color, UA     Clarity, UA     Glucose, UA Negative Negative   Bilirubin, UA     Ketones, UA neg    Spec Grav, UA     Blood, UA neg    pH, UA     POC,PROTEIN,UA Negative Negative, Trace, Small (1+), Moderate (2+), Large (3+), 4+   Urobilinogen, UA     Nitrite, UA neg    Leukocytes, UA Moderate (2+) (A) Negative   Appearance     Odor       Orders Placed This Encounter  Procedures  . Urine Culture  . POC Urinalysis Dipstick OB    Plan:  Continued routine  obstetrical care,        Return in about 4 weeks (around 09/13/2018) for PN2/LROB.

## 2018-08-16 NOTE — Patient Instructions (Addendum)
Tracy Stevenson, I greatly value your feedback.  If you receive a survey following your visit with us today, we appreciate you taking the time to fill it out.  Thanks, Tracy Stevenson, CNM     Second Trimester of Pregnancy The second trimester is from week 14 through week 27 (months 4 through 6). The second trimester is often a time when you feel your best. Your body has adjusted to being pregnant, and you begin to feel better physically. Usually, morning sickness has lessened or quit completely, you may have more energy, and you may have an increase in appetite. The second trimester is also a time when the fetus is growing rapidly. At the end of the sixth month, the fetus is about 9 inches long and weighs about 1 pounds. You will likely begin to feel the baby move (quickening) between 16 and 20 weeks of pregnancy. Body changes during your second trimester Your body continues to go through many changes during your second trimester. The changes vary from woman to woman.  Your weight will continue to increase. You will notice your lower abdomen bulging out.  You may begin to get stretch marks on your hips, abdomen, and breasts.  You may develop headaches that can be relieved by medicines. The medicines should be approved by your health care provider.  You may urinate more often because the fetus is pressing on your bladder.  You may develop or continue to have heartburn as a result of your pregnancy.  You may develop constipation because certain hormones are causing the muscles that push waste through your intestines to slow down.  You may develop hemorrhoids or swollen, bulging veins (varicose veins).  You may have back pain. This is caused by: ? Weight gain. ? Pregnancy hormones that are relaxing the joints in your pelvis. ? A shift in weight and the muscles that support your balance.  Your breasts will continue to grow and they will continue to become tender.  Your gums may  bleed and may be sensitive to brushing and flossing.  Dark spots or blotches (chloasma, mask of pregnancy) may develop on your face. This will likely fade after the baby is born.  A dark line from your belly button to the pubic area (linea nigra) may appear. This will likely fade after the baby is born.  You may have changes in your hair. These can include thickening of your hair, rapid growth, and changes in texture. Some women also have hair loss during or after pregnancy, or hair that feels dry or thin. Your hair will most likely return to normal after your baby is born.  What to expect at prenatal visits During a routine prenatal visit:  You will be weighed to make sure you and the fetus are growing normally.  Your blood pressure will be taken.  Your abdomen will be measured to track your baby's growth.  The fetal heartbeat will be listened to.  Any test results from the previous visit will be discussed.  Your health care provider may ask you:  How you are feeling.  If you are feeling the baby move.  If you have had any abnormal symptoms, such as leaking fluid, bleeding, severe headaches, or abdominal cramping.  If you are using any tobacco products, including cigarettes, chewing tobacco, and electronic cigarettes.  If you have any questions.  Other tests that may be performed during your second trimester include:  Blood tests that check for: ? Low iron levels (anemia). ? High  blood sugar that affects pregnant women (gestational diabetes) between 42 and 28 weeks. ? Rh antibodies. This is to check for a protein on red blood cells (Rh factor).  Urine tests to check for infections, diabetes, or protein in the urine.  An ultrasound to confirm the proper growth and development of the baby.  An amniocentesis to check for possible genetic problems.  Fetal screens for spina bifida and Down syndrome.  HIV (human immunodeficiency virus) testing. Routine prenatal testing  includes screening for HIV, unless you choose not to have this test.  Follow these instructions at home: Medicines  Follow your health care provider's instructions regarding medicine use. Specific medicines may be either safe or unsafe to take during pregnancy.  Take a prenatal vitamin that contains at least 600 micrograms (mcg) of folic acid.  If you develop constipation, try taking a stool softener if your health care provider approves. Eating and drinking  Eat a balanced diet that includes fresh fruits and vegetables, whole grains, good sources of protein such as meat, eggs, or tofu, and low-fat dairy. Your health care provider will help you determine the amount of weight gain that is right for you.  Avoid raw meat and uncooked cheese. These carry germs that can cause birth defects in the baby.  If you have low calcium intake from food, talk to your health care provider about whether you should take a daily calcium supplement.  Limit foods that are high in fat and processed sugars, such as fried and sweet foods.  To prevent constipation: ? Drink enough fluid to keep your urine clear or pale yellow. ? Eat foods that are high in fiber, such as fresh fruits and vegetables, whole grains, and beans. Activity  Exercise only as directed by your health care provider. Most women can continue their usual exercise routine during pregnancy. Try to exercise for 30 minutes at least 5 days a week. Stop exercising if you experience uterine contractions.  Avoid heavy lifting, wear low heel shoes, and practice good posture.  A sexual relationship may be continued unless your health care provider directs you otherwise. Relieving pain and discomfort  Wear a good support bra to prevent discomfort from breast tenderness.  Take warm sitz baths to soothe any pain or discomfort caused by hemorrhoids. Use hemorrhoid cream if your health care provider approves.  Rest with your legs elevated if you have  leg cramps or low back pain.  If you develop varicose veins, wear support hose. Elevate your feet for 15 minutes, 3-4 times a day. Limit salt in your diet. Prenatal Care  Write down your questions. Take them to your prenatal visits.  Keep all your prenatal visits as told by your health care provider. This is important. Safety  Wear your seat belt at all times when driving.  Make a list of emergency phone numbers, including numbers for family, friends, the hospital, and police and fire departments. General instructions  Ask your health care provider for a referral to a local prenatal education class. Begin classes no later than the beginning of month 6 of your pregnancy.  Ask for help if you have counseling or nutritional needs during pregnancy. Your health care provider can offer advice or refer you to specialists for help with various needs.  Do not use hot tubs, steam rooms, or saunas.  Do not douche or use tampons or scented sanitary pads.  Do not cross your legs for long periods of time.  Avoid cat litter boxes and soil  used by cats. These carry germs that can cause birth defects in the baby and possibly loss of the fetus by miscarriage or stillbirth.  Avoid all smoking, herbs, alcohol, and unprescribed drugs. Chemicals in these products can affect the formation and growth of the baby.  Do not use any products that contain nicotine or tobacco, such as cigarettes and e-cigarettes. If you need help quitting, ask your health care provider.  Visit your dentist if you have not gone yet during your pregnancy. Use a soft toothbrush to brush your teeth and be gentle when you floss. Contact a health care provider if:  You have dizziness.  You have mild pelvic cramps, pelvic pressure, or nagging pain in the abdominal area.  You have persistent nausea, vomiting, or diarrhea.  You have a bad smelling vaginal discharge.  You have pain when you urinate. Get help right away if:  You  have a fever.  You are leaking fluid from your vagina.  You have spotting or bleeding from your vagina.  You have severe abdominal cramping or pain.  You have rapid weight gain or weight loss.  You have shortness of breath with chest pain.  You notice sudden or extreme swelling of your face, hands, ankles, feet, or legs.  You have not felt your baby move in over an hour.  You have severe headaches that do not go away when you take medicine.  You have vision changes. Summary  The second trimester is from week 14 through week 27 (months 4 through 6). It is also a time when the fetus is growing rapidly.  Your body goes through many changes during pregnancy. The changes vary from woman to woman.  Avoid all smoking, herbs, alcohol, and unprescribed drugs. These chemicals affect the formation and growth your baby.  Do not use any tobacco products, such as cigarettes, chewing tobacco, and e-cigarettes. If you need help quitting, ask your health care provider.  Contact your health care provider if you have any questions. Keep all prenatal visits as told by your health care provider. This is important. This information is not intended to replace advice given to you by your health care provider. Make sure you discuss any questions you have with your health care provider.      CHILDBIRTH CLASSES [phone] is the phone number for Pregnancy Classes or hospital tours at [facility].   You will be referred to  [email or web address] for more information on childbirth classes  At this site you may register for classes. You may sign up for a waiting list if classes are full. Please SIGN UP FOR THIS!.   When the waiting list becomes [place], sometimes new classes can be added.   1. Before your test, do not eat or drink anything for 8-10 hours prior to your  appointment (a small amount of water is  allowed and you may take any medicines you normally take). Be sure to drink lots of water the day before. 2. When you arrive, your blood will be drawn for a 'fasting' blood sugar level.  Then you will be given a sweetened carbonated beverage to drink. You should  complete drinking this beverage within five minutes. After finishing the  beverage, you will have your blood drawn exactly 1 and 2 hours later. Having  your blood drawn on time is an important part of this test. A total of three blood  samples will be done. 3. The test takes approximately 2  hours. During the test, do  not have anything to  eat or drink. Do not smoke, chew gum (not even sugarless gum) or use breath mints.  4. During the test you should remain close by and seated as much as possible and  avoid walking around. You may want to bring a book or something else to  occupy your time.  5. After your test, you may eat and drink as normal. You may want to bring a snack  to eat after the test is finished. Your provider will advise you as to the results of  this test and any follow-up if necessary  If your sugar test is positive for gestational diabetes, you will be given an phone call and further instructions discussed. If you wish to know all of your test results before your next appointment, feel free to call the office, or look up your test results on Mychart.  (The range that the lab uses for normal values of the sugar test are not necessarily the range that is used for pregnant women; if your results are within the normal range, they are definitely normal.  However, if a value is deemed "high" by the lab, it may not be too high for a pregnant woman.  We will need to discuss the results if your value(s) fall in the "high" category).     Tdap Vaccine  It is recommended that you get the Tdap vaccine during the third trimester of EACH pregnancy to help protect your baby from getting pertussis (whooping cough)  27-36 weeks is the  BEST time to do this so that you can pass the protection on to your baby. During pregnancy is better than after pregnancy, but if you are unable to get it during pregnancy it will be offered at the hospital.  You will be offered this vaccine in the office after 27 weeks.  If you do not have health insurance, you can get the vaccine from the [facility] Department (no appointment needed).   Everyone who will be around your baby should also be up-to-date on their vaccines. Adults (who are not pregnant) only need 1 dose of Tdap during adulthood.

## 2018-08-16 NOTE — Progress Notes (Signed)
US 22+2 wks,cephalic,posterior placenta gr 0,cx 2.9 cm,anatomy of the spine complete,no obvious abnormalities,svp of fluid 6 cm,fhr 129 bpm,bilat adnexa's wnl,efw 562 g 81%

## 2018-08-18 LAB — URINE CULTURE

## 2018-09-13 ENCOUNTER — Other Ambulatory Visit: Payer: Medicaid Other

## 2018-09-13 ENCOUNTER — Encounter: Payer: Medicaid Other | Admitting: Women's Health

## 2018-09-22 ENCOUNTER — Other Ambulatory Visit: Payer: Medicaid Other

## 2018-09-22 ENCOUNTER — Ambulatory Visit (INDEPENDENT_AMBULATORY_CARE_PROVIDER_SITE_OTHER): Payer: Medicaid Other | Admitting: Women's Health

## 2018-09-22 ENCOUNTER — Encounter: Payer: Self-pay | Admitting: Women's Health

## 2018-09-22 VITALS — BP 102/64 | HR 60 | Wt 157.5 lb

## 2018-09-22 DIAGNOSIS — Z331 Pregnant state, incidental: Secondary | ICD-10-CM

## 2018-09-22 DIAGNOSIS — Z23 Encounter for immunization: Secondary | ICD-10-CM | POA: Diagnosis not present

## 2018-09-22 DIAGNOSIS — Z1389 Encounter for screening for other disorder: Secondary | ICD-10-CM

## 2018-09-22 DIAGNOSIS — O2342 Unspecified infection of urinary tract in pregnancy, second trimester: Secondary | ICD-10-CM | POA: Insufficient documentation

## 2018-09-22 DIAGNOSIS — Z3482 Encounter for supervision of other normal pregnancy, second trimester: Secondary | ICD-10-CM

## 2018-09-22 DIAGNOSIS — F1120 Opioid dependence, uncomplicated: Secondary | ICD-10-CM

## 2018-09-22 DIAGNOSIS — O99320 Drug use complicating pregnancy, unspecified trimester: Secondary | ICD-10-CM

## 2018-09-22 DIAGNOSIS — Z3A27 27 weeks gestation of pregnancy: Secondary | ICD-10-CM

## 2018-09-22 LAB — POCT URINALYSIS DIPSTICK OB
Blood, UA: NEGATIVE
Glucose, UA: NEGATIVE
KETONES UA: NEGATIVE
LEUKOCYTES UA: NEGATIVE
NITRITE UA: NEGATIVE
PROTEIN: NEGATIVE

## 2018-09-22 NOTE — Progress Notes (Signed)
   LOW-RISK PREGNANCY VISIT Patient name: Tracy Stevenson MRN [id number]  Date of birth: Nov 11, 1980 Chief Complaint:   Routine Prenatal Visit (PN2)  History of Present Illness:   Tracy Stevenson is a 38 y.o. G64P1001 female at [redacted]w[redacted]d with an Estimated Date of Delivery: 12/18/18 being seen today for ongoing management of a low-risk pregnancy.  Today she reports no complaints. Contractions: Not present. Vag. Bleeding: None.  Movement: Present. denies leaking of fluid. Review of Systems:   Pertinent items are noted in HPI Denies abnormal vaginal discharge w/ itching/odor/irritation, headaches, visual changes, shortness of breath, chest pain, abdominal pain, severe nausea/vomiting, or problems with urination or bowel movements unless otherwise stated above. Pertinent History Reviewed:  Reviewed past medical,surgical, social, obstetrical and family history.  Reviewed problem list, medications and allergies. Physical Assessment:   Vitals:   09/22/18 0906  BP: 102/64  Pulse: 60  Weight: 157 lb 8 oz (71.4 kg)  Body mass index is 23.95 kg/m.        Physical Examination:   General appearance: Well appearing, and in no distress  Mental status: Alert, oriented to person, place, and time  Skin: Warm & dry  Cardiovascular: Normal heart rate noted  Respiratory: Normal respiratory effort, no distress  Abdomen: Soft, gravid, nontender  Pelvic: Cervical exam deferred         Extremities: Edema: None  Fetal Status: Fetal Heart Rate (bpm): 125 Fundal Height: 27 cm Movement: Present    Results for orders placed or performed in visit on 09/22/18 (from the past 24 hour(s))  POC Urinalysis Dipstick OB   Collection Time: 09/22/18  9:12 AM  Result Value Ref Range   Color, UA     Clarity, UA     Glucose, UA Negative Negative   Bilirubin, UA     Ketones, UA neg    Spec Grav, UA     Blood, UA neg    pH, UA     POC,PROTEIN,UA Negative Negative, Trace, Small (1+), Moderate (2+), Large (3+), 4+   Urobilinogen, UA     Nitrite, UA neg    Leukocytes, UA Negative Negative   Appearance     Odor      Assessment & Plan:  1) Low-risk pregnancy G2P1001 at [redacted]w[redacted]d with an Estimated Date of Delivery: 12/18/18   2) Suboxone therapy, 8mg  TID by Dr. Cathey Endow, sees her weekly, therapist weekly, discussed/offered NAS/NICU tour, number given to schedule   Meds: No orders of the defined types were placed in this encounter.  Labs/procedures today: pn2, tdap  Plan:  Continue routine obstetrical care   Reviewed: Preterm labor symptoms and general obstetric precautions including but not limited to vaginal bleeding, contractions, leaking of fluid and fetal movement were reviewed in detail with the patient.  All questions were answered  Follow-up: Return in about 4 weeks (around 10/20/2018) for LROB.  Orders Placed This Encounter  Procedures  . Tdap vaccine greater than or equal to 7yo IM  . POC Urinalysis Dipstick OB   Cheral Marker CNM, [facility] 09/22/2018 9:56 AM

## 2018-09-22 NOTE — Patient Instructions (Addendum)
Tracy Stevenson, I greatly value your feedback.  If you receive a survey following your visit with us today, we appreciate you taking the time to fill it out.  Thanks, Joellyn HaffKim Booker, CNM, WHNP-BC  NICU tour/consult with neonatal nurse-practitioner  [phone]    Call the office [phone]) or go to [facility] if:  You begin to have strong, frequent contractions  Your water breaks.  Sometimes it is a big gush of fluid, sometimes it is just a trickle that keeps getting your panties wet or running down your legs  You have vaginal bleeding.  It is normal to have a small amount of spotting if your cervix was checked.   You don't feel your baby moving like normal.  If you don't, get you something to eat and drink and lay down and focus on feeling your baby move.  You should feel at least 10 movements in 2 hours.  If you don't, you should call the office or go to [facility].    Tdap Vaccine  It is recommended that you get the Tdap vaccine during the third trimester of EACH pregnancy to help protect your baby from getting pertussis (whooping cough)  27-36 weeks is the BEST time to do this so that you can pass the protection on to your baby. During pregnancy is better than after pregnancy, but if you are unable to get it during pregnancy it will be offered at the hospital.   You can get this vaccine with us, at the health department, your family doctor, or some local pharmacies  Everyone who will be around your baby should also be up-to-date on their vaccines before the baby comes. Adults (who are not pregnant) only need 1 dose of Tdap during adulthood.   Third Trimester of Pregnancy The third trimester is from week 29 through week 42, months 7 through 9. The third trimester is a time when the fetus is growing rapidly. At the end of the ninth month, the fetus is about 20 inches in length and weighs 6-10 pounds.  BODY CHANGES Your body goes through many changes during pregnancy. The  changes vary from woman to woman.   Your weight will continue to increase. You can expect to gain 25-35 pounds (11-16 kg) by the end of the pregnancy.  You may begin to get stretch marks on your hips, abdomen, and breasts.  You may urinate more often because the fetus is moving lower into your pelvis and pressing on your bladder.  You may develop or continue to have heartburn as a result of your pregnancy.  You may develop constipation because certain hormones are causing the muscles that push waste through your intestines to slow down.  You may develop hemorrhoids or swollen, bulging veins (varicose veins).  You may have pelvic pain because of the weight gain and pregnancy hormones relaxing your joints between the bones in your pelvis. Backaches may result from overexertion of the muscles supporting your posture.  You may have changes in your hair. These can include thickening of your hair, rapid growth, and changes in texture. Some women also have hair loss during or after pregnancy, or hair that feels dry or thin. Your hair will most likely return to normal after your baby is born.  Your breasts will continue to grow and be tender. A yellow discharge may leak from your breasts called colostrum.  Your belly button may stick out.  You may feel short of breath because of your expanding uterus.  You  may notice the fetus "dropping," or moving lower in your abdomen.  You may have a bloody mucus discharge. This usually occurs a few days to a week before labor begins.  Your cervix becomes thin and soft (effaced) near your due date. WHAT TO EXPECT AT YOUR PRENATAL EXAMS  You will have prenatal exams every 2 weeks until week 36. Then, you will have weekly prenatal exams. During a routine prenatal visit:  You will be weighed to make sure you and the fetus are growing normally.  Your blood pressure is taken.  Your abdomen will be measured to track your baby's growth.  The fetal heartbeat  will be listened to.  Any test results from the previous visit will be discussed.  You may have a cervical check near your due date to see if you have effaced. At around 36 weeks, your caregiver will check your cervix. At the same time, your caregiver will also perform a test on the secretions of the vaginal tissue. This test is to determine if a type of bacteria, Group B streptococcus, is present. Your caregiver will explain this further. Your caregiver may ask you:  What your birth plan is.  How you are feeling.  If you are feeling the baby move.  If you have had any abnormal symptoms, such as leaking fluid, bleeding, severe headaches, or abdominal cramping.  If you have any questions. Other tests or screenings that may be performed during your third trimester include:  Blood tests that check for low iron levels (anemia).  Fetal testing to check the health, activity level, and growth of the fetus. Testing is done if you have certain medical conditions or if there are problems during the pregnancy. FALSE LABOR You may feel small, irregular contractions that eventually go away. These are called Braxton Hicks contractions, or false labor. Contractions may last for hours, days, or even weeks before true labor sets in. If contractions come at regular intervals, intensify, or become painful, it is best to be seen by your caregiver.  SIGNS OF LABOR   Menstrual-like cramps.  Contractions that are 5 minutes apart or less.  Contractions that start on the top of the uterus and spread down to the lower abdomen and back.  A sense of increased pelvic pressure or back pain.  A watery or bloody mucus discharge that comes from the vagina. If you have any of these signs before the 37th week of pregnancy, call your caregiver right away. You need to go to the hospital to get checked immediately. HOME CARE INSTRUCTIONS   Avoid all smoking, herbs, alcohol, and unprescribed drugs. These chemicals  affect the formation and growth of the baby.  Follow your caregiver's instructions regarding medicine use. There are medicines that are either safe or unsafe to take during pregnancy.  Exercise only as directed by your caregiver. Experiencing uterine cramps is a good sign to stop exercising.  Continue to eat regular, healthy meals.  Wear a good support bra for breast tenderness.  Do not use hot tubs, steam rooms, or saunas.  Wear your seat belt at all times when driving.  Avoid raw meat, uncooked cheese, cat litter boxes, and soil used by cats. These carry germs that can cause birth defects in the baby.  Take your prenatal vitamins.  Try taking a stool softener (if your caregiver approves) if you develop constipation. Eat more high-fiber foods, such as fresh vegetables or fruit and whole grains. Drink plenty of fluids to keep your urine clear  or pale yellow.  Take warm sitz baths to soothe any pain or discomfort caused by hemorrhoids. Use hemorrhoid cream if your caregiver approves.  If you develop varicose veins, wear support hose. Elevate your feet for 15 minutes, 3-4 times a day. Limit salt in your diet.  Avoid heavy lifting, wear low heal shoes, and practice good posture.  Rest a lot with your legs elevated if you have leg cramps or low back pain.  Visit your dentist if you have not gone during your pregnancy. Use a soft toothbrush to brush your teeth and be gentle when you floss.  A sexual relationship may be continued unless your caregiver directs you otherwise.  Do not travel far distances unless it is absolutely necessary and only with the approval of your caregiver.  Take prenatal classes to understand, practice, and ask questions about the labor and delivery.  Make a trial run to the hospital.  Pack your hospital bag.  Prepare the baby's nursery.  Continue to go to all your prenatal visits as directed by your caregiver. SEEK MEDICAL CARE IF:  You are unsure if  you are in labor or if your water has broken.  You have dizziness.  You have mild pelvic cramps, pelvic pressure, or nagging pain in your abdominal area.  You have persistent nausea, vomiting, or diarrhea.  You have a bad smelling vaginal discharge.  You have pain with urination. SEEK IMMEDIATE MEDICAL CARE IF:   You have a fever.  You are leaking fluid from your vagina.  You have spotting or bleeding from your vagina.  You have severe abdominal cramping or pain.  You have rapid weight loss or gain.  You have shortness of breath with chest pain.  You notice sudden or extreme swelling of your face, hands, ankles, feet, or legs.  You have not felt your baby move in over an hour.  You have severe headaches that do not go away with medicine.  You have vision changes. Document Released: 08/11/2001 Document Revised: 08/22/2013 Document Reviewed: 10/18/2012 [facility] Patient Information 2015 [place], [place]. This information is not intended to replace advice given to you by your health care provider. Make sure you discuss any questions you have with your health care provider.

## 2018-09-23 LAB — CBC
HEMATOCRIT: 34.1 % (ref 34.0–46.6)
HEMOGLOBIN: 11.2 g/dL (ref 11.1–15.9)
MCH: 27.7 pg (ref 26.6–33.0)
MCHC: 32.8 g/dL (ref 31.5–35.7)
MCV: 84 fL (ref 79–97)
Platelets: 225 10*3/uL (ref 150–450)
RBC: 4.05 x10E6/uL (ref 3.77–5.28)
RDW: 12.9 % (ref 11.7–15.4)
WBC: 5.4 10*3/uL (ref 3.4–10.8)

## 2018-09-23 LAB — RPR: RPR: NONREACTIVE

## 2018-09-23 LAB — GLUCOSE TOLERANCE, 2 HOURS W/ 1HR
GLUCOSE, 1 HOUR: 166 mg/dL (ref 65–179)
GLUCOSE, 2 HOUR: 102 mg/dL (ref 65–152)
GLUCOSE, FASTING: 76 mg/dL (ref 65–91)

## 2018-09-23 LAB — HIV ANTIBODY (ROUTINE TESTING W REFLEX): HIV SCREEN 4TH GENERATION: NONREACTIVE

## 2018-09-23 LAB — ANTIBODY SCREEN: ANTIBODY SCREEN: NEGATIVE

## 2018-09-26 ENCOUNTER — Other Ambulatory Visit: Payer: Self-pay | Admitting: Women's Health

## 2018-09-26 MED ORDER — ONDANSETRON HCL 4 MG PO TABS: 4.0000 mg | ORAL_TABLET | Freq: Three times a day (TID) | ORAL | 0 refills | Status: DC | PRN

## 2018-10-13 ENCOUNTER — Other Ambulatory Visit: Payer: Self-pay

## 2018-10-13 ENCOUNTER — Inpatient Hospital Stay (HOSPITAL_COMMUNITY)
Admission: AD | Admit: 2018-10-13 | Discharge: 2018-10-13 | Disposition: A | Discharge status: Home / Self Care | Payer: Medicaid Other | Attending: Obstetrics & Gynecology | Admitting: Obstetrics & Gynecology

## 2018-10-13 DIAGNOSIS — F17210 Nicotine dependence, cigarettes, uncomplicated: Secondary | ICD-10-CM | POA: Insufficient documentation

## 2018-10-13 DIAGNOSIS — K2100 Gastro-esophageal reflux disease with esophagitis, without bleeding: Secondary | ICD-10-CM

## 2018-10-13 DIAGNOSIS — W182XXA Fall in (into) shower or empty bathtub, initial encounter: Secondary | ICD-10-CM | POA: Diagnosis not present

## 2018-10-13 DIAGNOSIS — O99333 Smoking (tobacco) complicating pregnancy, third trimester: Secondary | ICD-10-CM | POA: Insufficient documentation

## 2018-10-13 DIAGNOSIS — O99613 Diseases of the digestive system complicating pregnancy, third trimester: Secondary | ICD-10-CM | POA: Insufficient documentation

## 2018-10-13 DIAGNOSIS — F17200 Nicotine dependence, unspecified, uncomplicated: Secondary | ICD-10-CM | POA: Diagnosis not present

## 2018-10-13 DIAGNOSIS — O26893 Other specified pregnancy related conditions, third trimester: Secondary | ICD-10-CM | POA: Diagnosis not present

## 2018-10-13 DIAGNOSIS — Z3A30 30 weeks gestation of pregnancy: Secondary | ICD-10-CM

## 2018-10-13 DIAGNOSIS — M549 Dorsalgia, unspecified: Secondary | ICD-10-CM | POA: Diagnosis not present

## 2018-10-13 DIAGNOSIS — K920 Hematemesis: Secondary | ICD-10-CM | POA: Diagnosis present

## 2018-10-13 DIAGNOSIS — Z3689 Encounter for other specified antenatal screening: Secondary | ICD-10-CM

## 2018-10-13 MED ORDER — CYCLOBENZAPRINE HCL 10 MG PO TABS: 10.0000 mg | ORAL_TABLET | Freq: Three times a day (TID) | ORAL | 0 refills | Status: DC | PRN

## 2018-10-13 MED ORDER — FAMOTIDINE 20 MG PO TABS: 20.0000 mg | ORAL_TABLET | Freq: Every day | ORAL | 1 refills | Status: DC

## 2018-10-13 NOTE — MAU Provider Note (Signed)
History     CSN: [id number]  Arrival date and time: 10/13/18 1321   First Provider Initiated Contact with Patient 10/13/18 1348      Chief Complaint  Patient presents with  . Blood in vomit   G2P1001 @30 .4 wks presenting with bloody emesis. Reports 1 episode of emesis today that had red blood in it. She was concerned this could affect the baby. States she has emesis from time to time if she eat/drinks something sweet. Drank a Dr. Reino Kent today prior. Also has heartburn periodically but doesn't use anything for it. She is tolerating po since. She uses Zofran prn. No nausea now. She also reports a fall 4 days ago, slipped getting out of the shower and hit her upper back. Has been sore since. Uses Tylenol and heating pad which helps. Reports good FM. No VB, LOF, and ctx.   OB History    Gravida  2   Para  1   Term  1   Preterm      AB      Living  1     SAB      TAB      Ectopic      Multiple      Live Births  1           Past Medical History:  Diagnosis Date  . Vaginal Pap smear, abnormal     No past surgical history on file.  No family history on file.  Social History   Tobacco Use  . Smoking status: Current Every Day Smoker    Packs/day: 0.25    Types: Cigarettes  . Smokeless tobacco: Never Used  Substance Use Topics  . Alcohol use: No  . Drug use: Not Currently    Types: Marijuana, Oxycodone    Allergies: No Known Allergies  Medications Prior to Admission  Medication Sig Dispense Refill Last Dose  . buprenorphine-naloxone (SUBOXONE) 8-2 mg SUBL SL tablet Place 1 tablet under the tongue 2 (two) times daily.    Taking  . ondansetron (ZOFRAN) 4 MG tablet Take 1 tablet (4 mg total) by mouth every 8 (eight) hours as needed for nausea or vomiting. 20 tablet 0   . Prenatal Vit-Fe Fumarate-FA (PRENATAL VITAMIN) 27-0.8 MG TABS Take 1 tablet by mouth daily. 30 tablet 3 Taking    Review of Systems  Gastrointestinal: Negative for abdominal pain.   Genitourinary: Negative for vaginal bleeding and vaginal discharge.  Musculoskeletal: Positive for back pain.   Physical Exam   Blood pressure 120/66, pulse 61, temperature 98.3 F (36.8 C), temperature source Oral, resp. rate 17.  Physical Exam  Constitutional: She is oriented to person, place, and time. She appears well-developed and well-nourished. No distress.  HENT:  Head: Normocephalic and atraumatic.  Neck: Normal range of motion.  Cardiovascular: Normal rate.  Respiratory: Effort normal. No respiratory distress.  GI: Soft. She exhibits no distension. There is no abdominal tenderness.  gravid  Musculoskeletal: Normal range of motion.        General: No deformity or edema.     Cervical back: She exhibits tenderness.     Thoracic back: Normal.     Lumbar back: Normal.  Neurological: She is alert and oriented to person, place, and time. Coordination normal.  Skin: Skin is warm and dry.  Psychiatric: She has a normal mood and affect.  EFM: 130 bpm, mod variability, + accels, no decels Toco: none  No results found for this or any previous visit (from  the past 24 hour(s)).  MAU Course  Procedures  MDM No evidence of injury. Will give Flexeril for muscle spasm, continue Tylenol and heat. FHT reactive, pt reasured. Recommend Pepcid daily at hs. Stable for discharge home.   Assessment and Plan   1. [redacted] weeks gestation of pregnancy   2. NST (non-stress test) reactive   3. Musculoskeletal back pain   4. Gastroesophageal reflux disease with esophagitis    Discharge home [facility] Rx Flexeril Rx Pepcid  Allergies as of 10/13/2018   No Known Allergies     Medication List    TAKE these medications   buprenorphine-naloxone 8-2 mg Subl SL tablet Commonly known as:  SUBOXONE Place 1 tablet under the tongue 2 (two) times daily.   cyclobenzaprine 10 MG tablet Commonly known as:  FLEXERIL Take 1 tablet (10 mg total) by mouth every 8 (eight)  hours as needed for muscle spasms.   famotidine 20 MG tablet Commonly known as:  PEPCID Take 1 tablet (20 mg total) by mouth at bedtime.   ondansetron 4 MG tablet Commonly known as:  ZOFRAN Take 1 tablet (4 mg total) by mouth every 8 (eight) hours as needed for nausea or vomiting.   Prenatal Vitamin 27-0.8 MG Tabs Take 1 tablet by mouth daily.      Donette LarryMelanie Emelyn Roen, CNM 10/13/2018, 1:58 PM

## 2018-10-13 NOTE — MAU Note (Signed)
Pt states she fell in shower tow days ago and today she noticed blood after she vomited. Pt is concerned that this is not normal.  Vital signs within normal limist, no vaginal bleeding, unsure to contractions, positive FM. Pt states no other Ob or medical concerns.

## 2018-10-13 NOTE — Discharge Instructions (Signed)
Gastroesophageal Reflux Disease, Adult Gastroesophageal reflux (GER) happens when acid from the stomach flows up into the tube that connects the mouth and the stomach (esophagus). Normally, food travels down the esophagus and stays in the stomach to be digested. With GER, food and stomach acid sometimes move back up into the esophagus. You may have a disease called gastroesophageal reflux disease (GERD) if the reflux:  Happens often.  Causes frequent or very bad symptoms.  Causes problems such as damage to the esophagus. When this happens, the esophagus becomes sore and swollen (inflamed). Over time, GERD can make small holes (ulcers) in the lining of the esophagus. What are the causes? This condition is caused by a problem with the muscle between the esophagus and the stomach. When this muscle is weak or not normal, it does not close properly to keep food and acid from coming back up from the stomach. The muscle can be weak because of:  Tobacco use.  Pregnancy.  Having a certain type of hernia (hiatal hernia).  Alcohol use.  Certain foods and drinks, such as coffee, chocolate, onions, and peppermint. What increases the risk? You are more likely to develop this condition if you:  Are overweight.  Have a disease that affects your connective tissue.  Use NSAID medicines. What are the signs or symptoms? Symptoms of this condition include:  Heartburn.  Difficult or painful swallowing.  The feeling of having a lump in the throat.  A bitter taste in the mouth.  Bad breath.  Having a lot of saliva.  Having an upset or bloated stomach.  Belching.  Chest pain. Different conditions can cause chest pain. Make sure you see your doctor if you have chest pain.  Shortness of breath or noisy breathing (wheezing).  Ongoing (chronic) cough or a cough at night.  Wearing away of the surface of teeth (tooth enamel).  Weight loss. How is this treated? Treatment will depend on how  bad your symptoms are. Your doctor may suggest:  Changes to your diet.  Medicine.  Surgery. Follow these instructions at home: Eating and drinking   Follow a diet as told by your doctor. You may need to avoid foods and drinks such as: ? Coffee and tea (with or without caffeine). ? Drinks that contain alcohol. ? Energy drinks and sports drinks. ? Bubbly (carbonated) drinks or sodas. ? Chocolate and cocoa. ? Peppermint and mint flavorings. ? Garlic and onions. ? Horseradish. ? Spicy and acidic foods. These include peppers, chili powder, curry powder, vinegar, hot sauces, and BBQ sauce. ? Citrus fruit juices and citrus fruits, such as oranges, lemons, and limes. ? Tomato-based foods. These include red sauce, chili, salsa, and pizza with red sauce. ? Fried and fatty foods. These include donuts, french fries, potato chips, and high-fat dressings. ? High-fat meats. These include hot dogs, rib eye steak, sausage, ham, and bacon. ? High-fat dairy items, such as whole milk, butter, and cream cheese.  Eat small meals often. Avoid eating large meals.  Avoid drinking large amounts of liquid with your meals.  Avoid eating meals during the 2-3 hours before bedtime.  Avoid lying down right after you eat.  Do not exercise right after you eat. Lifestyle   Do not use any products that contain nicotine or tobacco. These include cigarettes, e-cigarettes, and chewing tobacco. If you need help quitting, ask your doctor.  Try to lower your stress. If you need help doing this, ask your doctor.  If you are overweight, lose an amount  of weight that is healthy for you. Ask your doctor about a safe weight loss goal. General instructions  Pay attention to any changes in your symptoms.  Take over-the-counter and prescription medicines only as told by your doctor. Do not take aspirin, ibuprofen, or other NSAIDs unless your doctor says it is okay.  Wear loose clothes. Do not wear anything tight  around your waist.  Raise (elevate) the head of your bed about 6 inches (15 cm).  Avoid bending over if this makes your symptoms worse.  Keep all follow-up visits as told by your doctor. This is important. Contact a doctor if:  You have new symptoms.  You lose weight and you do not know why.  You have trouble swallowing or it hurts to swallow.  You have wheezing or a cough that keeps happening.  Your symptoms do not get better with treatment.  You have a hoarse voice. Get help right away if:  You have pain in your arms, neck, jaw, teeth, or back.  You feel sweaty, dizzy, or light-headed.  You have chest pain or shortness of breath.  You throw up (vomit) and your throw-up looks like blood or coffee grounds.  You pass out (faint).  Your poop (stool) is bloody or black.  You cannot swallow, drink, or eat. Summary  If a person has gastroesophageal reflux disease (GERD), food and stomach acid move back up into the esophagus and cause symptoms or problems such as damage to the esophagus.  Treatment will depend on how bad your symptoms are.  Follow a diet as told by your doctor.  Take all medicines only as told by your doctor. This information is not intended to replace advice given to you by your health care provider. Make sure you discuss any questions you have with your health care provider. Document Released: 02/03/2008 Document Revised: 02/23/2018 Document Reviewed: 02/23/2018 Elsevier Interactive Patient Education  2019 [employer]   Acute Back Pain, Adult Acute back pain is sudden and usually short-lived. It is often caused by an injury to the muscles and tissues in the back. The injury may result from:  A muscle or ligament getting overstretched or torn (strained). Ligaments are tissues that connect bones to each other. Lifting something improperly can cause a back strain.  Wear and tear (degeneration) of the spinal disks. Spinal disks are circular tissue  that provides cushioning between the bones of the spine (vertebrae).  Twisting motions, such as while playing sports or doing yard work.  A hit to the back.  Arthritis. You may have a physical exam, lab tests, and imaging tests to find the cause of your pain. Acute back pain usually goes away with rest and home care. Follow these instructions at home: Managing pain, stiffness, and swelling  Take over-the-counter and prescription medicines only as told by your health care provider.  Your health care provider may recommend applying ice during the first 24-48 hours after your pain starts. To do this: ? Put ice in a plastic bag. ? Place a towel between your skin and the bag. ? Leave the ice on for 20 minutes, 2-3 times a day.  If directed, apply heat to the affected area as often as told by your health care provider. Use the heat source that your health care provider recommends, such as a moist heat pack or a heating pad. ? Place a towel between your skin and the heat source. ? Leave the heat on for 20-30 minutes. ? Remove the heat  if your skin turns bright red. This is especially important if you are unable to feel pain, heat, or cold. You have a greater risk of getting burned. Activity   Do not stay in bed. Staying in bed for more than 1-2 days can delay your recovery.  Sit up and stand up straight. Avoid leaning forward when you sit, or hunching over when you stand. ? If you work at a desk, sit close to it so you do not need to lean over. Keep your chin tucked in. Keep your neck drawn back, and keep your elbows bent at a right angle. Your arms should look like the letter "L." ? Sit high and close to the steering wheel when you drive. Add lower back (lumbar) support to your car seat, if needed.  Take short walks on even surfaces as soon as you are able. Try to increase the length of time you walk each day.  Do not sit, drive, or stand in one place for more than 30 minutes at a time.  Sitting or standing for long periods of time can put stress on your back.  Do not drive or use heavy machinery while taking prescription pain medicine.  Use proper lifting techniques. When you bend and lift, use positions that put less stress on your back: ? [place]end your knees. ? Keep the load close to your body. ? Avoid twisting.  Exercise regularly as told by your health care provider. Exercising helps your back heal faster and helps prevent back injuries by keeping muscles strong and flexible.  Work with a physical therapist to make a safe exercise program, as recommended by your health care provider. Do any exercises as told by your physical therapist. Lifestyle  Maintain a healthy weight. Extra weight puts stress on your back and makes it difficult to have good posture.  Avoid activities or situations that make you feel anxious or stressed. Stress and anxiety increase muscle tension and can make back pain worse. Learn ways to manage anxiety and stress, such as through exercise. General instructions  Sleep on a firm mattress in a comfortable position. Try lying on your side with your knees slightly bent. If you lie on your back, put a pillow under your knees.  Follow your treatment plan as told by your health care provider. This may include: ? Cognitive or behavioral therapy. ? Acupuncture or massage therapy. ? Meditation or yoga. Contact a health care provider if:  You have pain that is not relieved with rest or medicine.  You have increasing pain going down into your legs or buttocks.  Your pain does not improve after 2 weeks.  You have pain at night.  You lose weight without trying.  You have a fever or chills. Get help right away if:  You develop new bowel or bladder control problems.  You have unusual weakness or numbness in your arms or legs.  You develop nausea or vomiting.  You develop abdominal pain.  You feel faint. Summary  Acute back pain is sudden and  usually short-lived.  Use proper lifting techniques. When you bend and lift, use positions that put less stress on your back.  Take over-the-counter and prescription medicines and apply heat or ice as directed by your health care provider. This information is not intended to replace advice given to you by your health care provider. Make sure you discuss any questions you have with your health care provider. Document Released: 08/17/2005 Document Revised: 03/24/2018 Document Reviewed: 03/31/2017 Elsevier  You have shortness of breath.   You have a severe headache.   You have severe weakness.  This information is not intended to replace advice given to you by your health care provider. Make sure you discuss any questions you have with your health care provider.  Document Released: 07/10/2004 Document Revised: 08/10/2016 Document Reviewed: 08/10/2016  Elsevier Interactive Patient Education  2019 [employer] Inc[employer]

## 2018-10-20 ENCOUNTER — Ambulatory Visit (INDEPENDENT_AMBULATORY_CARE_PROVIDER_SITE_OTHER): Payer: Medicaid Other | Admitting: Advanced Practice Midwife

## 2018-10-20 VITALS — BP 116/69 | HR 66 | Wt 158.0 lb

## 2018-10-20 DIAGNOSIS — Z3A31 31 weeks gestation of pregnancy: Secondary | ICD-10-CM

## 2018-10-20 DIAGNOSIS — F1120 Opioid dependence, uncomplicated: Secondary | ICD-10-CM

## 2018-10-20 DIAGNOSIS — Z331 Pregnant state, incidental: Secondary | ICD-10-CM

## 2018-10-20 DIAGNOSIS — Z1389 Encounter for screening for other disorder: Secondary | ICD-10-CM

## 2018-10-20 DIAGNOSIS — O99320 Drug use complicating pregnancy, unspecified trimester: Secondary | ICD-10-CM

## 2018-10-20 DIAGNOSIS — Z3483 Encounter for supervision of other normal pregnancy, third trimester: Secondary | ICD-10-CM

## 2018-10-20 LAB — POCT URINALYSIS DIPSTICK OB
GLUCOSE, UA: NEGATIVE
KETONES UA: NEGATIVE
Leukocytes, UA: NEGATIVE
Nitrite, UA: NEGATIVE
POC,PROTEIN,UA: NEGATIVE
RBC UA: NEGATIVE

## 2018-10-20 NOTE — Progress Notes (Signed)
  G2P1001 [redacted]w[redacted]d Estimated Date of Delivery: 12/18/18  Blood pressure 116/69, pulse 66, weight 158 lb (71.7 kg).   BP weight and urine results all reviewed and noted.  Please refer to the obstetrical flow sheet for the fundal height and fetal heart rate documentation:  Patient reports good fetal movement, denies any bleeding and no rupture of membranes symptoms or regular contractions. Patient is without complaints. All questions were answered.   Physical Assessment:   Vitals:   10/20/18 0949  BP: 116/69  Pulse: 66  Weight: 158 lb (71.7 kg)  Body mass index is 24.02 kg/m.        Physical Examination:   General appearance: Well appearing, and in no distress  Mental status: Alert, oriented to person, place, and time  Skin: Warm & dry  Cardiovascular: Normal heart rate noted  Respiratory: Normal respiratory effort, no distress  Abdomen: Soft, gravid, nontender  Pelvic: Cervical exam deferred         Extremities: Edema: None  Fetal Status: Fetal Heart Rate (bpm): 130 Fundal Height: 28 cm Movement: Present    Results for orders placed or performed in visit on 10/20/18 (from the past 24 hour(s))  POC Urinalysis Dipstick OB   Collection Time: 10/20/18  9:52 AM  Result Value Ref Range   Color, UA     Clarity, UA     Glucose, UA Negative Negative   Bilirubin, UA     Ketones, UA neg    Spec Grav, UA     Blood, UA neg    pH, UA     POC,PROTEIN,UA Negative Negative, Trace, Small (1+), Moderate (2+), Large (3+), 4+   Urobilinogen, UA     Nitrite, UA neg    Leukocytes, UA Negative Negative   Appearance     Odor       Orders Placed This Encounter  Procedures  . POC Urinalysis Dipstick OB    Plan:  Continued routine obstetrical care, NAS consult ordered  No follow-ups on file.

## 2018-10-20 NOTE — Patient Instructions (Signed)
Tracy Stevenson, I greatly value your feedback.  If you receive a survey following your visit with [place] today, we appreciate you taking the time to fill it out.  Thanks, Tracy Stevenson, CNM   Call the office [phone]) or go to [facility] if:  You begin to have strong, frequent contractions  Your water breaks.  Sometimes it is a big gush of fluid, sometimes it is just a trickle that keeps getting your panties wet or running down your legs  You have vaginal bleeding.  It is normal to have a small amount of spotting if your cervix was checked.   You don't feel your baby moving like normal.  If you don't, get you something to eat and drink and lay down and focus on feeling your baby move.  You should feel at least 10 movements in 2 hours.  If you don't, you should call the office or go to [facility].    Tdap Vaccine  It is recommended that you get the Tdap vaccine during the third trimester of EACH pregnancy to help protect your baby from getting pertussis (whooping cough)  27-36 weeks is the BEST time to do this so that you can pass the protection on to your baby. During pregnancy is better than after pregnancy, but if you are unable to get it during pregnancy it will be offered at the hospital.   You will be offered this vaccine in the office after 27 weeks. If you do not have health insurance, you can get this vaccine at the health department or your family doctor  Everyone who will be around your baby should also be up-to-date on their vaccines. Adults (who are not pregnant) only need 1 dose of Tdap during adulthood.   Third Trimester of Pregnancy The third trimester is from week 29 through week 42, months 7 through 9. The third trimester is a time when the fetus is growing rapidly. At the end of the ninth month, the fetus is about 20 inches in length and weighs 6-10 pounds.  BODY CHANGES Your body goes through many changes during pregnancy. The changes vary from woman to  woman.   Your weight will continue to increase. You can expect to gain 25-35 pounds (11-16 kg) by the end of the pregnancy.  You may begin to get stretch marks on your hips, abdomen, and breasts.  You may urinate more often because the fetus is moving lower into your pelvis and pressing on your bladder.  You may develop or continue to have heartburn as a result of your pregnancy.  You may develop constipation because certain hormones are causing the muscles that push waste through your intestines to slow down.  You may develop hemorrhoids or swollen, bulging veins (varicose veins).  You may have pelvic pain because of the weight gain and pregnancy hormones relaxing your joints between the bones in your pelvis. Backaches may result from overexertion of the muscles supporting your posture.  You may have changes in your hair. These can include thickening of your hair, rapid growth, and changes in texture. Some women also have hair loss during or after pregnancy, or hair that feels dry or thin. Your hair will most likely return to normal after your baby is born.  Your breasts will continue to grow and be tender. A yellow discharge may leak from your breasts called colostrum.  Your belly button may stick out.  You may feel short of breath because of your expanding uterus.  You  may notice the fetus "dropping," or moving lower in your abdomen.  You may have a bloody mucus discharge. This usually occurs a few days to a week before labor begins.  Your cervix becomes thin and soft (effaced) near your due date. WHAT TO EXPECT AT YOUR PRENATAL EXAMS  You will have prenatal exams every 2 weeks until week 36. Then, you will have weekly prenatal exams. During a routine prenatal visit:  You will be weighed to make sure you and the fetus are growing normally.  Your blood pressure is taken.  Your abdomen will be measured to track your baby's growth.  The fetal heartbeat will be listened  to.  Any test results from the previous visit will be discussed.  You may have a cervical check near your due date to see if you have effaced. At around 36 weeks, your caregiver will check your cervix. At the same time, your caregiver will also perform a test on the secretions of the vaginal tissue. This test is to determine if a type of bacteria, Group B streptococcus, is present. Your caregiver will explain this further. Your caregiver may ask you:  What your birth plan is.  How you are feeling.  If you are feeling the baby move.  If you have had any abnormal symptoms, such as leaking fluid, bleeding, severe headaches, or abdominal cramping.  If you have any questions. Other tests or screenings that may be performed during your third trimester include:  Blood tests that check for low iron levels (anemia).  Fetal testing to check the health, activity level, and growth of the fetus. Testing is done if you have certain medical conditions or if there are problems during the pregnancy. FALSE LABOR You may feel small, irregular contractions that eventually go away. These are called Braxton Hicks contractions, or false labor. Contractions may last for hours, days, or even weeks before true labor sets in. If contractions come at regular intervals, intensify, or become painful, it is best to be seen by your caregiver.  SIGNS OF LABOR   Menstrual-like cramps.  Contractions that are 5 minutes apart or less.  Contractions that start on the top of the uterus and spread down to the lower abdomen and back.  A sense of increased pelvic pressure or back pain.  A watery or bloody mucus discharge that comes from the vagina. If you have any of these signs before the 37th week of pregnancy, call your caregiver right away. You need to go to the hospital to get checked immediately. HOME CARE INSTRUCTIONS   Avoid all smoking, herbs, alcohol, and unprescribed drugs. These chemicals affect the  formation and growth of the baby.  Follow your caregiver's instructions regarding medicine use. There are medicines that are either safe or unsafe to take during pregnancy.  Exercise only as directed by your caregiver. Experiencing uterine cramps is a good sign to stop exercising.  Continue to eat regular, healthy meals.  Wear a good support bra for breast tenderness.  Do not use hot tubs, steam rooms, or saunas.  Wear your seat belt at all times when driving.  Avoid raw meat, uncooked cheese, cat litter boxes, and soil used by cats. These carry germs that can cause birth defects in the baby.  Take your prenatal vitamins.  Try taking a stool softener (if your caregiver approves) if you develop constipation. Eat more high-fiber foods, such as fresh vegetables or fruit and whole grains. Drink plenty of fluids to keep your urine clear  or pale yellow.  Take warm sitz baths to soothe any pain or discomfort caused by hemorrhoids. Use hemorrhoid cream if your caregiver approves.  If you develop varicose veins, wear support hose. Elevate your feet for 15 minutes, 3-4 times a day. Limit salt in your diet.  Avoid heavy lifting, wear low heal shoes, and practice good posture.  Rest a lot with your legs elevated if you have leg cramps or low back pain.  Visit your dentist if you have not gone during your pregnancy. Use a soft toothbrush to brush your teeth and be gentle when you floss.  A sexual relationship may be continued unless your caregiver directs you otherwise.  Do not travel far distances unless it is absolutely necessary and only with the approval of your caregiver.  Take prenatal classes to understand, practice, and ask questions about the labor and delivery.  Make a trial run to the hospital.  Pack your hospital bag.  Prepare the baby's nursery.  Continue to go to all your prenatal visits as directed by your caregiver. SEEK MEDICAL CARE IF:  You are unsure if you are in  labor or if your water has broken.  You have dizziness.  You have mild pelvic cramps, pelvic pressure, or nagging pain in your abdominal area.  You have persistent nausea, vomiting, or diarrhea.  You have a bad smelling vaginal discharge.  You have pain with urination. SEEK IMMEDIATE MEDICAL CARE IF:   You have a fever.  You are leaking fluid from your vagina.  You have spotting or bleeding from your vagina.  You have severe abdominal cramping or pain.  You have rapid weight loss or gain.  You have shortness of breath with chest pain.  You notice sudden or extreme swelling of your face, hands, ankles, feet, or legs.  You have not felt your baby move in over an hour.  You have severe headaches that do not go away with medicine.  You have vision changes. Document Released: 08/11/2001 Document Revised: 08/22/2013 Document Reviewed: 10/18/2012 [facility] Patient Information 2015 [place], [place]. This information is not intended to replace advice given to you by your health care provider. Make sure you discuss any questions you have with your health care provider.

## 2018-11-03 ENCOUNTER — Other Ambulatory Visit: Payer: Self-pay

## 2018-11-03 ENCOUNTER — Ambulatory Visit (INDEPENDENT_AMBULATORY_CARE_PROVIDER_SITE_OTHER): Payer: Medicaid Other | Admitting: Women's Health

## 2018-11-03 ENCOUNTER — Encounter: Payer: Self-pay | Admitting: Women's Health

## 2018-11-03 VITALS — BP 127/52 | HR 72 | Wt 162.5 lb

## 2018-11-03 DIAGNOSIS — Z3A33 33 weeks gestation of pregnancy: Secondary | ICD-10-CM

## 2018-11-03 DIAGNOSIS — Z3483 Encounter for supervision of other normal pregnancy, third trimester: Secondary | ICD-10-CM

## 2018-11-03 DIAGNOSIS — F1120 Opioid dependence, uncomplicated: Secondary | ICD-10-CM

## 2018-11-03 DIAGNOSIS — O26893 Other specified pregnancy related conditions, third trimester: Secondary | ICD-10-CM | POA: Diagnosis not present

## 2018-11-03 DIAGNOSIS — O26843 Uterine size-date discrepancy, third trimester: Secondary | ICD-10-CM

## 2018-11-03 DIAGNOSIS — Z1389 Encounter for screening for other disorder: Secondary | ICD-10-CM

## 2018-11-03 DIAGNOSIS — N898 Other specified noninflammatory disorders of vagina: Secondary | ICD-10-CM

## 2018-11-03 DIAGNOSIS — O99320 Drug use complicating pregnancy, unspecified trimester: Secondary | ICD-10-CM

## 2018-11-03 DIAGNOSIS — Z331 Pregnant state, incidental: Secondary | ICD-10-CM

## 2018-11-03 LAB — POCT URINALYSIS DIPSTICK OB
Blood, UA: NEGATIVE
GLUCOSE, UA: NEGATIVE
Ketones, UA: NEGATIVE
Leukocytes, UA: NEGATIVE
Nitrite, UA: NEGATIVE
POC,PROTEIN,UA: NEGATIVE

## 2018-11-03 LAB — POCT WET PREP (WET MOUNT)
Clue Cells Wet Prep Whiff POC: POSITIVE
Trichomonas Wet Prep HPF POC: ABSENT

## 2018-11-03 MED ORDER — METRONIDAZOLE 500 MG PO TABS: 500.0000 mg | ORAL_TABLET | Freq: Two times a day (BID) | ORAL | 0 refills | Status: DC

## 2018-11-03 NOTE — Patient Instructions (Addendum)
Tracy Stevenson, I greatly value your feedback.  If you receive a survey following your visit with [place] today, we appreciate you taking the time to fill it out.  Thanks, Joellyn Haff, CNM, [facility]  [facility] HAS MOVED!!! It is now [facility] ([address] [place], [place] [address]) Entrance located off of [address] Free 24/7 valet parking     Call the office [phone]) or go to [facility] if:  You begin to have strong, frequent contractions  Your water breaks.  Sometimes it is a big gush of fluid, sometimes it is just a trickle that keeps getting your panties wet or running down your legs  You have vaginal bleeding.  It is normal to have a small amount of spotting if your cervix was checked.   You don't feel your baby moving like normal.  If you don't, get you something to eat and drink and lay down and focus on feeling your baby move.  You should feel at least 10 movements in 2 hours.  If you don't, you should call the office or go to [facility].    Tdap Vaccine  It is recommended that you get the Tdap vaccine during the third trimester of EACH pregnancy to help protect your baby from getting pertussis (whooping cough)  27-36 weeks is the BEST time to do this so that you can pass the protection on to your baby. During pregnancy is better than after pregnancy, but if you are unable to get it during pregnancy it will be offered at the hospital.   You can get this vaccine with [place], at the health department, your family doctor, or some local pharmacies  Everyone who will be around your baby should also be up-to-date on their vaccines before the baby comes. Adults (who are not pregnant) only need 1 dose of Tdap during adulthood.   Third Trimester of Pregnancy The third trimester is from week 29 through week 42, months 7 through 9. The third trimester is a time when the fetus is growing rapidly. At the end of the ninth month, the fetus is  about 20 inches in length and weighs 6-10 pounds.  BODY CHANGES Your body goes through many changes during pregnancy. The changes vary from woman to woman.   Your weight will continue to increase. You can expect to gain 25-35 pounds (11-16 kg) by the end of the pregnancy.  You may begin to get stretch marks on your hips, abdomen, and breasts.  You may urinate more often because the fetus is moving lower into your pelvis and pressing on your bladder.  You may develop or continue to have heartburn as a result of your pregnancy.  You may develop constipation because certain hormones are causing the muscles that push waste through your intestines to slow down.  You may develop hemorrhoids or swollen, bulging veins (varicose veins).  You may have pelvic pain because of the weight gain and pregnancy hormones relaxing your joints between the bones in your pelvis. Backaches may result from overexertion of the muscles supporting your posture.  You may have changes in your hair. These can include thickening of your hair, rapid growth, and changes in texture. Some women also have hair loss during or after pregnancy, or hair that feels dry or thin. Your hair will most likely return to normal after your baby is born.  Your breasts will continue to grow and be tender. A yellow discharge may leak  from your breasts called colostrum.  Your belly button may stick out.  You may feel short of breath because of your expanding uterus.  You may notice the fetus "dropping," or moving lower in your abdomen.  You may have a bloody mucus discharge. This usually occurs a few days to a week before labor begins.  Your cervix becomes thin and soft (effaced) near your due date. WHAT TO EXPECT AT YOUR PRENATAL EXAMS  You will have prenatal exams every 2 weeks until week 36. Then, you will have weekly prenatal exams. During a routine prenatal visit:  You will be weighed to make sure you and the fetus are growing  normally.  Your blood pressure is taken.  Your abdomen will be measured to track your baby's growth.  The fetal heartbeat will be listened to.  Any test results from the previous visit will be discussed.  You may have a cervical check near your due date to see if you have effaced. At around 36 weeks, your caregiver will check your cervix. At the same time, your caregiver will also perform a test on the secretions of the vaginal tissue. This test is to determine if a type of bacteria, Group B streptococcus, is present. Your caregiver will explain this further. Your caregiver may ask you:  What your birth plan is.  How you are feeling.  If you are feeling the baby move.  If you have had any abnormal symptoms, such as leaking fluid, bleeding, severe headaches, or abdominal cramping.  If you have any questions. Other tests or screenings that may be performed during your third trimester include:  Blood tests that check for low iron levels (anemia).  Fetal testing to check the health, activity level, and growth of the fetus. Testing is done if you have certain medical conditions or if there are problems during the pregnancy. FALSE LABOR You may feel small, irregular contractions that eventually go away. These are called Braxton Hicks contractions, or false labor. Contractions may last for hours, days, or even weeks before true labor sets in. If contractions come at regular intervals, intensify, or become painful, it is best to be seen by your caregiver.  SIGNS OF LABOR   Menstrual-like cramps.  Contractions that are 5 minutes apart or less.  Contractions that start on the top of the uterus and spread down to the lower abdomen and back.  A sense of increased pelvic pressure or back pain.  A watery or bloody mucus discharge that comes from the vagina. If you have any of these signs before the 37th week of pregnancy, call your caregiver right away. You need to go to the hospital to  get checked immediately. HOME CARE INSTRUCTIONS   Avoid all smoking, herbs, alcohol, and unprescribed drugs. These chemicals affect the formation and growth of the baby.  Follow your caregiver's instructions regarding medicine use. There are medicines that are either safe or unsafe to take during pregnancy.  Exercise only as directed by your caregiver. Experiencing uterine cramps is a good sign to stop exercising.  Continue to eat regular, healthy meals.  Wear a good support bra for breast tenderness.  Do not use hot tubs, steam rooms, or saunas.  Wear your seat belt at all times when driving.  Avoid raw meat, uncooked cheese, cat litter boxes, and soil used by cats. These carry germs that can cause birth defects in the baby.  Take your prenatal vitamins.  Try taking a stool softener (if your caregiver approves)  if you develop constipation. Eat more high-fiber foods, such as fresh vegetables or fruit and whole grains. Drink plenty of fluids to keep your urine clear or pale yellow.  Take warm sitz baths to soothe any pain or discomfort caused by hemorrhoids. Use hemorrhoid cream if your caregiver approves.  If you develop varicose veins, wear support hose. Elevate your feet for 15 minutes, 3-4 times a day. Limit salt in your diet.  Avoid heavy lifting, wear low heal shoes, and practice good posture.  Rest a lot with your legs elevated if you have leg cramps or low back pain.  Visit your dentist if you have not gone during your pregnancy. Use a soft toothbrush to brush your teeth and be gentle when you floss.  A sexual relationship may be continued unless your caregiver directs you otherwise.  Do not travel far distances unless it is absolutely necessary and only with the approval of your caregiver.  Take prenatal classes to understand, practice, and ask questions about the labor and delivery.  Make a trial run to the hospital.  Pack your hospital bag.  Prepare the baby's  nursery.  Continue to go to all your prenatal visits as directed by your caregiver. SEEK MEDICAL CARE IF:  You are unsure if you are in labor or if your water has broken.  You have dizziness.  You have mild pelvic cramps, pelvic pressure, or nagging pain in your abdominal area.  You have persistent nausea, vomiting, or diarrhea.  You have a bad smelling vaginal discharge.  You have pain with urination. SEEK IMMEDIATE MEDICAL CARE IF:   You have a fever.  You are leaking fluid from your vagina.  You have spotting or bleeding from your vagina.  You have severe abdominal cramping or pain.  You have rapid weight loss or gain.  You have shortness of breath with chest pain.  You notice sudden or extreme swelling of your face, hands, ankles, feet, or legs.  You have not felt your baby move in over an hour.  You have severe headaches that do not go away with medicine.  You have vision changes. Document Released: 08/11/2001 Document Revised: 08/22/2013 Document Reviewed: 10/18/2012 [facility] Patient Information 2015 [place], [place]. This information is not intended to replace advice given to you by your health care provider. Make sure you discuss any questions you have with your health care provider.   NICU tour/consult with neonatal nurse-practitioner  [phone]

## 2018-11-03 NOTE — Progress Notes (Signed)
LOW-RISK PREGNANCY VISIT Patient name: Tracy Stevenson MRN [id number]  Date of birth: September 04, 1980 Chief Complaint:   Routine Prenatal Visit (spotting yesterday after intercourse, discharge, pressure)  History of Present Illness:   Tracy Stevenson is a 38 y.o. G80P1001 female at [redacted]w[redacted]d with an Estimated Date of Delivery: 12/18/18 being seen today for ongoing management of a low-risk pregnancy.  Today she reports spotting after sex, discharge, pressure. Contractions: Not present. Vag. Bleeding: Scant.  Movement: Present. denies leaking of fluid. Review of Systems:   Pertinent items are noted in HPI Denies abnormal vaginal discharge w/ itching/odor/irritation, headaches, visual changes, shortness of breath, chest pain, abdominal pain, severe nausea/vomiting, or problems with urination or bowel movements unless otherwise stated above. Pertinent History Reviewed:  Reviewed past medical,surgical, social, obstetrical and family history.  Reviewed problem list, medications and allergies. Physical Assessment:   Vitals:   11/03/18 1014  BP: (!) 127/52  Pulse: 72  Weight: 162 lb 8 oz (73.7 kg)  Body mass index is 24.71 kg/m.        Physical Examination:   General appearance: Well appearing, and in no distress  Mental status: Alert, oriented to person, place, and time  Skin: Warm & dry  Cardiovascular: Normal heart rate noted  Respiratory: Normal respiratory effort, no distress  Abdomen: Soft, gravid, nontender  Pelvic: spec exam: cx visually long/closed, small amt malodorous d/c, no bleeding         Extremities: Edema: None  Fetal Status: Fetal Heart Rate (bpm): 150 Fundal Height: 28 cm Movement: Present    Results for orders placed or performed in visit on 11/03/18 (from the past 24 hour(s))  POC Urinalysis Dipstick OB   Collection Time: 11/03/18 10:17 AM  Result Value Ref Range   Color, UA     Clarity, UA     Glucose, UA Negative Negative   Bilirubin, UA     Ketones, UA neg    Spec  Grav, UA     Blood, UA neg    pH, UA     POC,PROTEIN,UA Negative Negative, Trace, Small (1+), Moderate (2+), Large (3+), 4+   Urobilinogen, UA     Nitrite, UA neg    Leukocytes, UA Negative Negative   Appearance     Odor    POCT Wet Prep [facility] [place])   Collection Time: 11/03/18 10:42 AM  Result Value Ref Range   Source Wet Prep POC vaginal    WBC, Wet Prep HPF POC few    Bacteria Wet Prep HPF POC Few Few   BACTERIA WET PREP MORPHOLOGY POC     Clue Cells Wet Prep HPF POC Moderate (A) None   Clue Cells Wet Prep Whiff POC Positive Whiff    Yeast Wet Prep HPF POC None None   KOH Wet Prep POC     Trichomonas Wet Prep HPF POC Absent Absent    Assessment & Plan:  1) Low-risk pregnancy G2P1001 at [redacted]w[redacted]d with an Estimated Date of Delivery: 12/18/18   2) BV, Rx metronidazole 500mg  BID x 7d for BV, no sex while taking   3) Subutex> 2mg  TID, contacted NICU for NAS consult today  4) Uterine s<d- will get efw/afi u/s   Meds:  Meds ordered this encounter  Medications  . metroNIDAZOLE (FLAGYL) 500 MG tablet    Sig: Take 1 tablet (500 mg total) by mouth 2 (two) times daily.    Dispense:  14 tablet    Refill:  0    Order Specific Question:  Supervising Provider    Answer:   Lazaro Arms [[id number]]   Labs/procedures today: wet prep, gc/ct  Plan:  Continue routine obstetrical care   Reviewed: Preterm labor symptoms and general obstetric precautions including but not limited to vaginal bleeding, contractions, leaking of fluid and fetal movement were reviewed in detail with the patient.  All questions were answered  Follow-up: Return for asap efw/afi u/s (no visit), then 2wks LROB.  Orders Placed This Encounter  Procedures  . GC/Chlamydia Probe Amp  . US OB Follow Up  . POC Urinalysis Dipstick OB  . POCT Wet Prep [facility])   Cheral Marker CNM, [facility] 11/03/2018 10:44 AM

## 2018-11-04 ENCOUNTER — Other Ambulatory Visit: Payer: Self-pay | Admitting: Women's Health

## 2018-11-04 ENCOUNTER — Ambulatory Visit (INDEPENDENT_AMBULATORY_CARE_PROVIDER_SITE_OTHER): Payer: Medicaid Other

## 2018-11-04 DIAGNOSIS — Z3A33 33 weeks gestation of pregnancy: Secondary | ICD-10-CM | POA: Diagnosis not present

## 2018-11-04 DIAGNOSIS — Z3483 Encounter for supervision of other normal pregnancy, third trimester: Secondary | ICD-10-CM

## 2018-11-04 DIAGNOSIS — O26843 Uterine size-date discrepancy, third trimester: Secondary | ICD-10-CM

## 2018-11-04 DIAGNOSIS — F17200 Nicotine dependence, unspecified, uncomplicated: Secondary | ICD-10-CM

## 2018-11-04 LAB — GC/CHLAMYDIA PROBE AMP
Chlamydia trachomatis, NAA: NEGATIVE
Neisseria gonorrhoeae by PCR: NEGATIVE

## 2018-11-04 NOTE — Progress Notes (Signed)
[place] 33+5 wks,cephalic,posterior placenta gr 3,fhr 137 bpm,afi 13.5 cm,BPP 8/8,RI .64,.54,.60=44%,EFW 1916 g 9.7%,AC 8%

## 2018-11-06 ENCOUNTER — Encounter: Payer: Self-pay | Admitting: Obstetrics and Gynecology

## 2018-11-06 DIAGNOSIS — O365990 Maternal care for other known or suspected poor fetal growth, unspecified trimester, not applicable or unspecified: Secondary | ICD-10-CM | POA: Insufficient documentation

## 2018-11-07 ENCOUNTER — Encounter: Payer: Self-pay | Admitting: Women's Health

## 2018-11-08 ENCOUNTER — Other Ambulatory Visit: Payer: Self-pay | Admitting: *Deleted

## 2018-11-08 ENCOUNTER — Ambulatory Visit (INDEPENDENT_AMBULATORY_CARE_PROVIDER_SITE_OTHER): Payer: Medicaid Other | Admitting: Women's Health

## 2018-11-08 ENCOUNTER — Encounter: Payer: Self-pay | Admitting: Women's Health

## 2018-11-08 VITALS — BP 126/83 | HR 64 | Wt 160.0 lb

## 2018-11-08 DIAGNOSIS — O99323 Drug use complicating pregnancy, third trimester: Secondary | ICD-10-CM | POA: Diagnosis not present

## 2018-11-08 DIAGNOSIS — O99320 Drug use complicating pregnancy, unspecified trimester: Secondary | ICD-10-CM

## 2018-11-08 DIAGNOSIS — O365930 Maternal care for other known or suspected poor fetal growth, third trimester, not applicable or unspecified: Secondary | ICD-10-CM | POA: Diagnosis not present

## 2018-11-08 DIAGNOSIS — O0993 Supervision of high risk pregnancy, unspecified, third trimester: Secondary | ICD-10-CM | POA: Diagnosis not present

## 2018-11-08 DIAGNOSIS — F1120 Opioid dependence, uncomplicated: Secondary | ICD-10-CM | POA: Diagnosis not present

## 2018-11-08 DIAGNOSIS — O0990 Supervision of high risk pregnancy, unspecified, unspecified trimester: Secondary | ICD-10-CM

## 2018-11-08 DIAGNOSIS — Z1389 Encounter for screening for other disorder: Secondary | ICD-10-CM

## 2018-11-08 DIAGNOSIS — Z3A34 34 weeks gestation of pregnancy: Secondary | ICD-10-CM

## 2018-11-08 DIAGNOSIS — Z331 Pregnant state, incidental: Secondary | ICD-10-CM

## 2018-11-08 LAB — POCT URINALYSIS DIPSTICK OB
Blood, UA: NEGATIVE
Glucose, UA: NEGATIVE
Ketones, UA: NEGATIVE
Leukocytes, UA: NEGATIVE
Nitrite, UA: NEGATIVE
POC,PROTEIN,UA: NEGATIVE

## 2018-11-08 NOTE — Progress Notes (Signed)
   HIGH-RISK PREGNANCY VISIT Patient name: Tracy Stevenson MRN [id number]  Date of birth: 09-Nov-1980 Chief Complaint:   Routine Prenatal Visit (NST)  History of Present Illness:   Tracy Stevenson is a 38 y.o. G47P1001 female at [redacted]w[redacted]d with an Estimated Date of Delivery: 12/18/18 being seen today for ongoing management of a high-risk pregnancy complicated by FGR EFW 9.7%; subutex therapy 2mg  TID. Had u/s for s<d on 3/6, EFW 9.7%,  AC 8%, FL 6.4%, normal dopplers/AFI, BPP was 8/8. Today she reports no complaints. Contractions: Not present. Vag. Bleeding: None.  Movement: Present. denies leaking of fluid.  Review of Systems:   Pertinent items are noted in HPI Denies abnormal vaginal discharge w/ itching/odor/irritation, headaches, visual changes, shortness of breath, chest pain, abdominal pain, severe nausea/vomiting, or problems with urination or bowel movements unless otherwise stated above. Pertinent History Reviewed:  Reviewed past medical,surgical, social, obstetrical and family history.  Reviewed problem list, medications and allergies. Physical Assessment:   Vitals:   11/08/18 1016  BP: 126/83  Pulse: 64  Weight: 160 lb (72.6 kg)  Body mass index is 24.33 kg/m.           Physical Examination:   General appearance: alert, well appearing, and in no distress  Mental status: alert, oriented to person, place, and time  Skin: warm & dry   Extremities: Edema: None    Cardiovascular: normal heart rate noted  Respiratory: normal respiratory effort, no distress  Abdomen: gravid, soft, non-tender  Pelvic: Cervical exam deferred         Fetal Status: Fetal Heart Rate (bpm): 125 Fundal Height: 28 cm Movement: Present    Fetal Surveillance Testing today: NST: FHR baseline 125 bpm, Variability: moderate, Accelerations:present, Decelerations:  Absent= Cat 1/Reactive Toco: none     Results for orders placed or performed in visit on 11/08/18 (from the past 24 hour(s))  POC Urinalysis Dipstick  OB   Collection Time: 11/08/18 10:40 AM  Result Value Ref Range   Color, UA     Clarity, UA     Glucose, UA Negative Negative   Bilirubin, UA     Ketones, UA neg    Spec Grav, UA     Blood, UA neg    pH, UA     POC,PROTEIN,UA Negative Negative, Trace, Small (1+), Moderate (2+), Large (3+), 4+   Urobilinogen, UA     Nitrite, UA neg    Leukocytes, UA Negative Negative   Appearance     Odor      Assessment & Plan:  1) High-risk pregnancy G2P1001 at [redacted]w[redacted]d with an Estimated Date of Delivery: 12/18/18   2) FGR, new dx 3/6, EFW 9.7%/AC 8%  3) Subutex therapy, 2mg  TID  Meds: No orders of the defined types were placed in this encounter.  Labs/procedures today: nst  Treatment Plan:  Growth u/s q 3wks    2x/wk testing nst alt w/ BPP/Dopp          Deliver prn or 39wks  Reviewed: Preterm labor symptoms and general obstetric precautions including but not limited to vaginal bleeding, contractions, leaking of fluid and fetal movement were reviewed in detail with the patient.  All questions were answered.  Follow-up: Return for Friday for bpp/dopp @ [facility], then Tues 3/10 NST w/ RN here, then 3/13 bpp/dopp HROB.  Orders Placed This Encounter  Procedures  . POC Urinalysis Dipstick OB   Cheral Marker CNM, [facility] 11/08/2018 11:46 AM

## 2018-11-08 NOTE — Patient Instructions (Signed)
Tracy Stevenson, I greatly value your feedback.  If you receive a survey following your visit with [place] today, we appreciate you taking the time to fill it out.  Thanks, Joellyn Haff, CNM, [facility]  [facility] HAS MOVED!!! It is now [facility] at [facility] ([address] [place], [place] [address]) Entrance located off of [address] Free 24/7 valet parking     Call the office [phone]) or go to [facility] if:  You begin to have strong, frequent contractions  Your water breaks.  Sometimes it is a big gush of fluid, sometimes it is just a trickle that keeps getting your panties wet or running down your legs  You have vaginal bleeding.  It is normal to have a small amount of spotting if your cervix was checked.   You don't feel your baby moving like normal.  If you don't, get you something to eat and drink and lay down and focus on feeling your baby move.  You should feel at least 10 movements in 2 hours.  If you don't, you should call the office or go to [facility].    Intrauterine Growth Restriction  Intrauterine growth restriction (IUGR) is when a baby is not growing normally during pregnancy. A baby with IUGR is smaller than it should be and may weigh less than normal at birth. IUGR can result from a problem with the organ that supplies the unborn baby (fetus) with oxygen and nutrition (placenta). Usually, there is no way to prevent this type of problem. Babies with IUGR are at higher risk for early delivery and needing special (intensive) care after birth. What are the causes? The most common cause of IUGR is a problem with the placenta or umbilical cord that causes the fetus to get less oxygen or nutrition than needed. Other causes include:  The mother eating a very unhealthy diet (poor maternal nutrition).  Exposure to chemicals found in substances such as cigarettes, alcohol, and some drugs.  Some prescription medicines.  Other problems that  develop in the womb (congenital birth defects).  Genetic disorders.  Infection.  Carrying more than one baby. What increases the risk? This condition is more likely to affect babies of mothers who:  Are over the age of 47 at the time of delivery.  Are younger than age 42 at the time of delivery.  Have medical conditions such as high blood pressure, preeclampsia, diabetes, heart or kidney disease, systemic lupus erythematosus, or anemia.  Live at a very high altitude during pregnancy.  Have a personal history or family history of: ? IUGR. ? A genetic disorder.  Take medicines during pregnancy that are related to congenital disabilities.  Come into contact with infected cat feces (toxoplasmosis).  Come into contact with chickenpox (varicella) or [place] measles (rubella).  Have or are at risk of getting an infectious disease such as syphilis, HIV, or herpes.  Eat an unhealthy diet during pregnancy.  Weigh less than 100 pounds.  Have had treatments to help her have children (infertility treatments).  Use tobacco, drugs, or alcohol during pregnancy. What are the signs or symptoms? IUGR does not cause many symptoms. You might notice that your baby does not move or kick very often. Also, your belly may not be as big as expected for the stage of your pregnancy. How is this diagnosed? This condition is diagnosed with physical exams and prenatal exams. You may also have:  Fundal measurements to check the size of your  uterus.  An ultrasound done to measure your baby's size compared to the size of other babies at the same stage of development (gestational age). Your health care provider will monitor your baby's growth with ultrasounds throughout pregnancy. You may also have tests to find the cause of IUGR. These may include:  Amniocentesis. This is a procedure that involves passing a needle into the uterus to collect a sample of fluid that surrounds the fetus (amniotic fluid). This  may be done to check for signs of infection or congenital defects.  Tests to evaluate blood flow to your baby and placenta. How is this treated? In most cases, the goal of treatment is to treat the cause of IUGR. Your health care providers will monitor your pregnancy closely and help you manage your pregnancy. If your condition is caused by a placenta problem and your baby is not getting enough blood, you may need:  Medicine to start labor and deliver your baby early (induction).  Cesarean delivery, also called a C-section. In this procedure, your baby is delivered through an incision in your abdomen and uterus. Follow these instructions at home: Medicines  Take over-the-counter and prescription medicines only as told by your health care provider. This includes vitamins and supplements.  Make sure that your health care provider knows about and approves of all the medicines, supplements, vitamins, eye drops, and creams that you use. General instructions  Eat a healthy diet that includes fresh fruits and vegetables, lean proteins, whole grains, and calcium-rich foods such as milk, yogurt, and dark, leafy greens. Work with your health care provider or a dietitian to make sure that: ? You are getting enough nutrients. ? You are gaining enough weight.  Rest as needed. Try to get at least 8 hours of sleep every night.  Do not drink alcohol or use drugs.  Do not use any products that contain nicotine or tobacco, such as cigarettes and e-cigarettes. If you need help quitting, ask your health care provider.  Keep all follow-up visits as told by your health care provider. This is important. Get help right away if you:  Notice that your baby is moving less than usual or is not moving.  Have contractions that are 5 minutes or less apart, or that increase in frequency, intensity, or length.  Have signs and symptoms of infection, including a fever.  Have vaginal bleeding.  Have increased  swelling in your legs, hands, or face.  Have vision changes, including seeing spots or having blurry or double vision.  Have a severe headache that does not go away.  Have sudden, sharp abdominal pain or low back pain.  Have an uncontrolled gush or trickle of fluid from your vagina. Summary  Intrauterine growth restriction (IUGR) is when a baby is not growing normally during pregnancy.  The most common cause of IUGR is a problem with the placenta or umbilical cord that causes the fetus to get less oxygen or nutrition than needed.  This condition is diagnosed with physical and prenatal exams. Your health care provider will monitor your baby's growth with ultrasounds throughout pregnancy.  Make sure that your health care provider knows about and approves of all the medicines, supplements, vitamins, eye drops, and creams that you use. This information is not intended to replace advice given to you by your health care provider. Make sure you discuss any questions you have with your health care provider. Document Released: 05/26/2008 Document Revised: 06/17/2017 Document Reviewed: 06/17/2017 Elsevier Interactive Patient Education  2019  [employer]

## 2018-11-10 ENCOUNTER — Encounter: Payer: Self-pay | Admitting: *Deleted

## 2018-11-10 ENCOUNTER — Telehealth: Payer: Self-pay | Admitting: *Deleted

## 2018-11-10 NOTE — Telephone Encounter (Signed)
Patient notified of ultrasounds scheduled at [facility] on [address] for 3/13 @ 3:45, 3/27 @ 12:45 and 4/10 @12 :45.

## 2018-11-11 ENCOUNTER — Other Ambulatory Visit: Payer: Self-pay

## 2018-11-11 ENCOUNTER — Ambulatory Visit (HOSPITAL_COMMUNITY)
Admission: RE | Admit: 2018-11-11 | Discharge: 2018-11-11 | Disposition: A | Discharge status: Home / Self Care | Payer: Medicaid Other | Source: Ambulatory Visit | Attending: Obstetrics and Gynecology | Admitting: Obstetrics and Gynecology

## 2018-11-11 DIAGNOSIS — O368930 Maternal care for other specified fetal problems, third trimester, not applicable or unspecified: Secondary | ICD-10-CM | POA: Diagnosis not present

## 2018-11-11 DIAGNOSIS — O99323 Drug use complicating pregnancy, third trimester: Secondary | ICD-10-CM

## 2018-11-11 DIAGNOSIS — O365930 Maternal care for other known or suspected poor fetal growth, third trimester, not applicable or unspecified: Secondary | ICD-10-CM | POA: Diagnosis not present

## 2018-11-11 DIAGNOSIS — O0990 Supervision of high risk pregnancy, unspecified, unspecified trimester: Secondary | ICD-10-CM | POA: Insufficient documentation

## 2018-11-11 DIAGNOSIS — Z3A34 34 weeks gestation of pregnancy: Secondary | ICD-10-CM | POA: Diagnosis present

## 2018-11-15 ENCOUNTER — Ambulatory Visit (INDEPENDENT_AMBULATORY_CARE_PROVIDER_SITE_OTHER): Payer: Medicaid Other | Admitting: *Deleted

## 2018-11-15 ENCOUNTER — Other Ambulatory Visit: Payer: Self-pay

## 2018-11-15 VITALS — BP 127/78 | HR 82 | Wt 161.0 lb

## 2018-11-15 DIAGNOSIS — O99323 Drug use complicating pregnancy, third trimester: Secondary | ICD-10-CM

## 2018-11-15 DIAGNOSIS — Z1389 Encounter for screening for other disorder: Secondary | ICD-10-CM

## 2018-11-15 DIAGNOSIS — O368930 Maternal care for other specified fetal problems, third trimester, not applicable or unspecified: Secondary | ICD-10-CM

## 2018-11-15 DIAGNOSIS — F17200 Nicotine dependence, unspecified, uncomplicated: Secondary | ICD-10-CM

## 2018-11-15 DIAGNOSIS — O0993 Supervision of high risk pregnancy, unspecified, third trimester: Secondary | ICD-10-CM

## 2018-11-15 DIAGNOSIS — Z331 Pregnant state, incidental: Secondary | ICD-10-CM

## 2018-11-15 DIAGNOSIS — Z3A34 34 weeks gestation of pregnancy: Secondary | ICD-10-CM | POA: Diagnosis not present

## 2018-11-15 LAB — POCT URINALYSIS DIPSTICK OB
Blood, UA: NEGATIVE
Glucose, UA: NEGATIVE
Ketones, UA: NEGATIVE
Leukocytes, UA: NEGATIVE
Nitrite, UA: NEGATIVE

## 2018-11-17 ENCOUNTER — Encounter: Payer: Self-pay | Admitting: Women's Health

## 2018-11-17 ENCOUNTER — Other Ambulatory Visit: Payer: Self-pay | Admitting: Women's Health

## 2018-11-17 ENCOUNTER — Other Ambulatory Visit: Payer: Self-pay

## 2018-11-17 DIAGNOSIS — O365930 Maternal care for other known or suspected poor fetal growth, third trimester, not applicable or unspecified: Secondary | ICD-10-CM

## 2018-11-18 ENCOUNTER — Other Ambulatory Visit: Payer: Self-pay

## 2018-11-18 ENCOUNTER — Encounter: Payer: Self-pay | Admitting: Women's Health

## 2018-11-18 ENCOUNTER — Ambulatory Visit (INDEPENDENT_AMBULATORY_CARE_PROVIDER_SITE_OTHER): Payer: Medicaid Other | Admitting: Women's Health

## 2018-11-18 ENCOUNTER — Ambulatory Visit (INDEPENDENT_AMBULATORY_CARE_PROVIDER_SITE_OTHER): Payer: Medicaid Other

## 2018-11-18 VITALS — BP 115/80 | HR 79 | Temp 98.4°F | Wt 158.2 lb

## 2018-11-18 DIAGNOSIS — Z3A35 35 weeks gestation of pregnancy: Secondary | ICD-10-CM

## 2018-11-18 DIAGNOSIS — Z331 Pregnant state, incidental: Secondary | ICD-10-CM

## 2018-11-18 DIAGNOSIS — Z1389 Encounter for screening for other disorder: Secondary | ICD-10-CM

## 2018-11-18 DIAGNOSIS — O2343 Unspecified infection of urinary tract in pregnancy, third trimester: Secondary | ICD-10-CM

## 2018-11-18 DIAGNOSIS — O365930 Maternal care for other known or suspected poor fetal growth, third trimester, not applicable or unspecified: Secondary | ICD-10-CM

## 2018-11-18 DIAGNOSIS — O99323 Drug use complicating pregnancy, third trimester: Secondary | ICD-10-CM

## 2018-11-18 DIAGNOSIS — F17200 Nicotine dependence, unspecified, uncomplicated: Secondary | ICD-10-CM

## 2018-11-18 DIAGNOSIS — O0993 Supervision of high risk pregnancy, unspecified, third trimester: Secondary | ICD-10-CM

## 2018-11-18 DIAGNOSIS — F1120 Opioid dependence, uncomplicated: Secondary | ICD-10-CM

## 2018-11-18 LAB — POCT URINALYSIS DIPSTICK OB
Blood, UA: NEGATIVE
Glucose, UA: NEGATIVE
KETONES UA: NEGATIVE

## 2018-11-18 MED ORDER — ONDANSETRON HCL 4 MG PO TABS: 4.0000 mg | ORAL_TABLET | Freq: Three times a day (TID) | ORAL | 0 refills | Status: DC | PRN

## 2018-11-18 MED ORDER — CEPHALEXIN 500 MG PO CAPS: 500.0000 mg | ORAL_CAPSULE | Freq: Four times a day (QID) | ORAL | 0 refills | Status: DC

## 2018-11-18 NOTE — Progress Notes (Signed)
HIGH-RISK PREGNANCY VISIT Patient name: Tracy Stevenson MRN [id number]  Date of birth: [birth year] Chief Complaint:   High Risk Gestation (u/s today/ still taking medication for uti/ refill zofran)  History of Present Illness:   Tracy Stevenson is a 38 y.o. G89P1001 female at [redacted]w[redacted]d with an Estimated Date of Delivery: 12/18/18 being seen today for ongoing management of a high-risk pregnancy complicated by FGR EFW 9.7% w/ elevated dopplers; subutex 2mg  TID.  Today she reports no complaints. Still taking metronidazole for BV- had to take once daily instead of BID d/t nausea, requests zofran. Denies UTI s/s.  Contractions: Regular.  .  Movement: Present. denies leaking of fluid.  Review of Systems:   Pertinent items are noted in HPI Denies abnormal vaginal discharge w/ itching/odor/irritation, headaches, visual changes, shortness of breath, chest pain, abdominal pain, severe nausea/vomiting, or problems with urination or bowel movements unless otherwise stated above. Pertinent History Reviewed:  Reviewed past medical,surgical, social, obstetrical and family history.  Reviewed problem list, medications and allergies. Physical Assessment:   Vitals:   11/18/18 1134  BP: 115/80  Pulse: 79  Temp: 98.4 F (36.9 C)  Weight: 158 lb 3.2 oz (71.8 kg)  Body mass index is 24.05 kg/m.           Physical Examination:   General appearance: alert, well appearing, and in no distress  Mental status: alert, oriented to person, place, and time  Skin: warm & dry   Extremities: Edema: None    Cardiovascular: normal heart rate noted  Respiratory: normal respiratory effort, no distress  Abdomen: gravid, soft, non-tender  Pelvic: Cervical exam deferred         Fetal Status: Fetal Heart Rate (bpm): 121 u/s   Movement: Present Presentation: Vertex  Fetal Surveillance Testing today: [place] 35+5 wks,cephalic,fhr 121 bpm,posterior placenta gr 3,afi 12 cm,elevated UAD with good end diastolic flow  .74,.70,.72,.71=95%,BPP 8/8  Results for orders placed or performed in visit on 11/18/18 (from the past 24 hour(s))  POC Urinalysis Dipstick OB   Collection Time: 11/18/18 11:42 AM  Result Value Ref Range   Color, UA     Clarity, UA     Glucose, UA Negative Negative   Bilirubin, UA     Ketones, UA neg    Spec Grav, UA     Blood, UA neg    pH, UA     POC,PROTEIN,UA Trace Negative, Trace, Small (1+), Moderate (2+), Large (3+), 4+   Urobilinogen, UA     Nitrite, UA postive    Leukocytes, UA Moderate (2+) (A) Negative   Appearance     Odor      Assessment & Plan:  1) High-risk pregnancy G2P1001 at [redacted]w[redacted]d with an Estimated Date of Delivery: 12/18/18   2) FGR w/ elevated dopplers, stable, no absent/reverse flow. Plan IOL @ 37wks. IOL scheduled for 3/30 @ [facility] (3/29 not available).  IOL form faxed via Epic and orders placed   3) Subutex therapy, 2mg  TID  4) ASB> rx keflex, send urine cx  Meds:  Meds ordered this encounter  Medications  . ondansetron (ZOFRAN) 4 MG tablet    Sig: Take 1 tablet (4 mg total) by mouth every 8 (eight) hours as needed for nausea or vomiting.    Dispense:  20 tablet    Refill:  0    Order Specific Question:   Supervising Provider    Answer:   Despina Hidden, LUTHER H [[id number]]  . cephALEXin (KEFLEX) 500 MG capsule    Sig: Take  1 capsule (500 mg total) by mouth 4 (four) times daily. X 7 days    Dispense:  28 capsule    Refill:  0    Order Specific Question:   Supervising Provider    Answer:   Lazaro Arms [[id number]]    Labs/procedures today: u/s  Treatment Plan:  NST Tues w/ gbs, BPP/dopp Thurs, IOL @ 37wks  Reviewed: Preterm labor symptoms and general obstetric precautions including but not limited to vaginal bleeding, contractions, leaking of fluid and fetal movement were reviewed in detail with the patient.  All questions were answered.  Follow-up: Return for As scheduled Tues for nst- add HROB please (cancel 3/31 appt and all appts after).  Orders Placed This  Encounter  Procedures  . Urine Culture  . POC Urinalysis Dipstick OB   Cheral Marker CNM, [facility] 11/18/2018 2:47 PM

## 2018-11-18 NOTE — Patient Instructions (Signed)
Legrand Como, I greatly value your feedback.  If you receive a survey following your visit with [place] today, we appreciate you taking the time to fill it out.  Thanks, Joellyn Haff, CNM, [facility]  [facility] HOSPITAL HAS MOVED!!! It is now [facility] & [facility] Center at [facility] ([address] [place], [place] [address]) Entrance located off of [address] Free 24/7 valet parking    Call the office [phone]) or go to [facility] if:  You begin to have strong, frequent contractions  Your water breaks.  Sometimes it is a big gush of fluid, sometimes it is just a trickle that keeps getting your panties wet or running down your legs  You have vaginal bleeding.  It is normal to have a small amount of spotting if your cervix was checked.   You don't feel your baby moving like normal.  If you don't, get you something to eat and drink and lay down and focus on feeling your baby move.  You should feel at least 10 movements in 2 hours.  If you don't, you should call the office or go to [facility].    Preterm Labor and Birth Information  The normal length of a pregnancy is 39-41 weeks. Preterm labor is when labor starts before 37 completed weeks of pregnancy. What are the risk factors for preterm labor? Preterm labor is more likely to occur in women who:  Have certain infections during pregnancy such as a bladder infection, sexually transmitted infection, or infection inside the uterus (chorioamnionitis).  Have a shorter-than-normal cervix.  Have gone into preterm labor before.  Have had surgery on their cervix.  Are younger than age 71 or older than age 32.  Are African American.  Are pregnant with twins or multiple babies (multiple gestation).  Take street drugs or smoke while pregnant.  Do not gain enough weight while pregnant.  Became pregnant shortly after having been pregnant. What are the symptoms of preterm labor? Symptoms of preterm labor include:  Cramps similar  to those that can happen during a menstrual period. The cramps may happen with diarrhea.  Pain in the abdomen or lower back.  Regular uterine contractions that may feel like tightening of the abdomen.  A feeling of increased pressure in the pelvis.  Increased watery or bloody mucus discharge from the vagina.  Water breaking (ruptured amniotic sac). Why is it important to recognize signs of preterm labor? It is important to recognize signs of preterm labor because babies who are born prematurely may not be fully developed. This can put them at an increased risk for:  Long-term (chronic) heart and lung problems.  Difficulty immediately after birth with regulating body systems, including blood sugar, body temperature, heart rate, and breathing rate.  Bleeding in the brain.  Cerebral palsy.  Learning difficulties.  Death. These risks are highest for babies who are born before 34 weeks of pregnancy. How is preterm labor treated? Treatment depends on the length of your pregnancy, your condition, and the health of your baby. It may involve:  Having a stitch (suture) placed in your cervix to prevent your cervix from opening too early (cerclage).  Taking or being given medicines, such as: ? Hormone medicines. These may be given early in pregnancy to help support the pregnancy. ? Medicine to stop contractions. ? Medicines to help mature the baby's lungs. These may be prescribed if the risk of delivery is high. ? Medicines to prevent your baby from developing cerebral palsy. If  the labor happens before 34 weeks of pregnancy, you may need to stay in the hospital. What should I do if I think I am in preterm labor? If you think that you are going into preterm labor, call your health care provider right away. How can I prevent preterm labor in future pregnancies? To increase your chance of having a full-term pregnancy:  Do not use any tobacco products, such as cigarettes, chewing tobacco,  and e-cigarettes. If you need help quitting, ask your health care provider.  Do not use street drugs or medicines that have not been prescribed to you during your pregnancy.  Talk with your health care provider before taking any herbal supplements, even if you have been taking them regularly.  Make sure you gain a healthy amount of weight during your pregnancy.  Watch for infection. If you think that you might have an infection, get it checked right away.  Make sure to tell your health care provider if you have gone into preterm labor before. This information is not intended to replace advice given to you by your health care provider. Make sure you discuss any questions you have with your health care provider. Document Released: 11/07/2003 Document Revised: 01/28/2016 Document Reviewed: 01/08/2016 Elsevier Interactive Patient Education  2019 [employer].  Coronavirus (COVID-19) Are you at risk?  Are you at risk for the Coronavirus (COVID-19)?  To be considered HIGH RISK for Coronavirus (COVID-19), you have to meet the following criteria:  . Traveled to [place], [place], [place], [place] or [place]; or in the [place] to [place], [place], [place], or [place]; and have fever, cough, and shortness of breath within the last 2 weeks of travel OR . Been in close contact with a person diagnosed with COVID-19 within the last 2 weeks and have fever, cough, and shortness of breath . IF YOU DO NOT MEET THESE CRITERIA, YOU ARE CONSIDERED LOW RISK FOR COVID-19.  What to do if you are HIGH RISK for COVID-19?  Marland Kitchen If you are having a medical emergency, call 911. . Seek medical care right away. Before you go to a doctor's office, urgent care or emergency department, call ahead and tell them about your recent travel, contact with someone diagnosed with COVID-19, and your symptoms. You should receive instructions from your physician's office regarding next steps of care.  . When you arrive at  healthcare provider, tell the healthcare staff immediately you have returned from visiting [place], [place], [place], [place] or [place]; or traveled in the [place] to [place], [place], [place], or [place]; in the last two weeks or you have been in close contact with a person diagnosed with COVID-19 in the last 2 weeks.   . Tell the health care staff about your symptoms: fever, cough and shortness of breath. . After you have been seen by a medical provider, you will be either: o Tested for (COVID-19) and discharged home on quarantine except to seek medical care if symptoms worsen, and asked to  - Stay home and avoid contact with others until you get your results (4-5 days)  - Avoid travel on public transportation if possible (such as bus, train, or airplane) or o Sent to the Emergency Department by EMS for evaluation, COVID-19 testing, and possible admission depending on your condition and test results.  What to do if you are LOW RISK for COVID-19?  Reduce your risk of any infection by using the same precautions used for avoiding the common  cold or flu:  Marland Kitchen Wash your hands often with soap and warm water for at least 20 seconds.  If soap and water are not readily available, use an alcohol-based hand sanitizer with at least 60% alcohol.  . If coughing or sneezing, cover your mouth and nose by coughing or sneezing into the elbow areas of your shirt or coat, into a tissue or into your sleeve (not your hands). . Avoid shaking hands with others and consider head nods or verbal greetings only. . Avoid touching your eyes, nose, or mouth with unwashed hands.  . Avoid close contact with people who are sick. . Avoid places or events with large numbers of people in one location, like concerts or sporting events. . Carefully consider travel plans you have or are making. . If you are planning any travel outside or inside the [place], visit the CDC's Travelers' Health webpage for the latest health notices.  . If you have some symptoms but not all symptoms, continue to monitor at home and seek medical attention if your symptoms worsen. . If you are having a medical emergency, call 911.   ADDITIONAL HEALTHCARE OPTIONS FOR PATIENTS  [facility] / e-Visit: [email or web address]         [facility]: [phone]  [facility]: [phone]                   [facility]: [phone]

## 2018-11-18 NOTE — Progress Notes (Signed)
[place] 35+5 wks,cephalic,fhr 121 bpm,posterior placenta gr 3,afi 12 cm,elevated UAD with good end diastolic flow .74,.70,.72,.71=95%,BPP 8/8

## 2018-11-18 NOTE — Treatment Plan (Signed)
Induction Assessment Scheduling Form Fax to [facility]:  [phone]  Tracy Stevenson                                                                                   DOB:  1980-09-09                                                            MRN:  [id number]                                                                     Phone #: [phone]                   Provider:  [facility]  GP:  [id number]                                                            Estimated Date of Delivery: 12/18/18  Dating Criteria: 13wk u/s    Medical Indications for induction:  FGR w/ elevated dopplers Admission Date/Time:  3/30 @ [facility] Gestational age on admission:  37.1wks   Filed Weights   11/18/18 1134  Weight: 158 lb 3.2 oz (71.8 kg)   HIV:  Non Reactive (01/23 0844) GBS:  pending  SVE: TBD   Method of induction(proposed):  cytotec   Scheduling Provider Signature:  Cheral Marker, CNM                                            Today's Date:  11/18/2018

## 2018-11-20 LAB — URINE CULTURE

## 2018-11-21 ENCOUNTER — Telehealth: Payer: Self-pay | Admitting: *Deleted

## 2018-11-21 NOTE — Telephone Encounter (Signed)
Left message letting pt know no visitors or children at tomorrow's appt and advised if she has come in contact or is experiencing symptoms of Covid-19, call office before coming to appt. JSY

## 2018-11-22 ENCOUNTER — Other Ambulatory Visit: Payer: Self-pay

## 2018-11-22 ENCOUNTER — Ambulatory Visit (INDEPENDENT_AMBULATORY_CARE_PROVIDER_SITE_OTHER): Payer: Medicaid Other | Admitting: Obstetrics and Gynecology

## 2018-11-22 ENCOUNTER — Other Ambulatory Visit: Payer: Self-pay | Admitting: Obstetrics & Gynecology

## 2018-11-22 ENCOUNTER — Encounter: Payer: Self-pay | Admitting: Obstetrics and Gynecology

## 2018-11-22 ENCOUNTER — Other Ambulatory Visit: Payer: Self-pay | Admitting: Advanced Practice Midwife

## 2018-11-22 VITALS — BP 119/77 | HR 80 | Wt 161.8 lb

## 2018-11-22 DIAGNOSIS — F17200 Nicotine dependence, unspecified, uncomplicated: Secondary | ICD-10-CM

## 2018-11-22 DIAGNOSIS — Z1389 Encounter for screening for other disorder: Secondary | ICD-10-CM

## 2018-11-22 DIAGNOSIS — Z3483 Encounter for supervision of other normal pregnancy, third trimester: Secondary | ICD-10-CM

## 2018-11-22 DIAGNOSIS — O365930 Maternal care for other known or suspected poor fetal growth, third trimester, not applicable or unspecified: Secondary | ICD-10-CM | POA: Diagnosis not present

## 2018-11-22 DIAGNOSIS — O99333 Smoking (tobacco) complicating pregnancy, third trimester: Secondary | ICD-10-CM

## 2018-11-22 DIAGNOSIS — O0993 Supervision of high risk pregnancy, unspecified, third trimester: Secondary | ICD-10-CM

## 2018-11-22 DIAGNOSIS — Z3A36 36 weeks gestation of pregnancy: Secondary | ICD-10-CM

## 2018-11-22 DIAGNOSIS — Z331 Pregnant state, incidental: Secondary | ICD-10-CM

## 2018-11-22 LAB — POCT URINALYSIS DIPSTICK OB
Glucose, UA: NEGATIVE
Ketones, UA: NEGATIVE
Leukocytes, UA: NEGATIVE
Nitrite, UA: NEGATIVE
POC,PROTEIN,UA: NEGATIVE
RBC UA: NEGATIVE

## 2018-11-22 NOTE — Progress Notes (Signed)
Patient ID: Tracy Stevenson, female   DOB: 09-Dec-1980, 38 y.o.   MRN: [id number]    [facility] PREGNANCY VISIT Patient name: Tracy Stevenson MRN [id number]  Date of birth: 1980-12-06 Chief Complaint:   High Risk Gestation (gbs-ch-gc/NST/ room # 8)  History of Present Illness:   Tracy Stevenson is a 38 y.o. G75P1001 female at [redacted]w[redacted]d with an Estimated Date of Delivery: 12/18/18 being seen today for ongoing management of a high-risk pregnancy complicated by FGR, subutex 2mg  BID. Last [place] 35+5 wks,cephalic,fhr 121 bpm,posterior placenta gr 3,afi 12 cm,elevated UAD with good end diastolic flow .74,.70,.72,.71=95%,BPP 8/8 done on 11/18/2018 Today she reports no complaints. Contractions: Regular.  .  Movement: Present. denies leaking of fluid.  Review of Systems:   Pertinent items are noted in HPI Denies abnormal vaginal discharge w/ itching/odor/irritation, headaches, visual changes, shortness of breath, chest pain, abdominal pain, severe nausea/vomiting, or problems with urination or bowel movements unless otherwise stated above. Pertinent History Reviewed:  Reviewed past medical,surgical, social, obstetrical and family history.  Reviewed problem list, medications and allergies. Physical Assessment:   Vitals:   11/22/18 1030  BP: 119/77  Pulse: 80  Weight: 161 lb 12.8 oz (73.4 kg)  Body mass index is 24.6 kg/m.           Physical Examination:   General appearance: alert, well appearing, and in no distress  Mental status: normal mood, behavior, speech, dress, motor activity, and thought processes, affect appropriate to mood  Skin: warm & dry   Extremities: Edema: None    Cardiovascular: normal heart rate noted  Respiratory: normal respiratory effort, no distress  Abdomen: gravid, soft, non-tender  Pelvic: Cervical exam performed rectal hemorrhoids, causes her no concern. cervix posterior long soft, 1 cm.        Fetal Status:   Fundal Height: 37 cm Movement: Present    Fetal Surveillance  Testing today: NST BR 120 ACCEL to 145  Results for orders placed or performed in visit on 11/22/18 (from the past 24 hour(s))  POC Urinalysis Dipstick OB   Collection Time: 11/22/18 10:32 AM  Result Value Ref Range   Color, UA     Clarity, UA     Glucose, UA Negative Negative   Bilirubin, UA     Ketones, UA neg    Spec Grav, UA     Blood, UA neg    pH, UA     POC,PROTEIN,UA Negative Negative, Trace, Small (1+), Moderate (2+), Large (3+), 4+   Urobilinogen, UA     Nitrite, UA neg    Leukocytes, UA Negative Negative   Appearance     Odor      Assessment & Plan:  1) High-risk pregnancy G2P1001 at [redacted]w[redacted]d with an Estimated Date of Delivery: 12/18/18    FGR less than 10%ile with elevated dopplers above 90%ile at last check, EDF present. 2) FGR, stable EFW at 9.7%ile at [redacted]W[redacted]d on 11/04/2018  Meds: No orders of the defined types were placed in this encounter.   Labs/procedures today: GBS/GCCHL, NST  Treatment Plan:  1. Repeat BPP in 3 d,                                2. Scheduled for induction 3/30 /2020 at [facility]                                3 GBS  GC CHL collected  Reviewed: IOL discussed. May be candidate for Cytotec, then FB  Cervical tissues soft.but posterior. Long 1 cm  Follow-up: Return in about 3 days (around 11/25/2018) for BPP.  Orders Placed This Encounter  Procedures  . Strep Gp B NAA  . GC/Chlamydia Probe Amp  . POC Urinalysis Dipstick OB   By signing my name below, I, Arnette Norris, attest that this documentation has been prepared under the direction and in the presence of Tilda Burrow, MD. Electronically Signed: Arnette Norris Medical Scribe. 11/22/18. 10:58 AM.  I personally performed the services described in this documentation, which was SCRIBED in my presence. The recorded information has been reviewed and considered accurate. It has been edited as necessary during review. Tilda Burrow, MD

## 2018-11-23 ENCOUNTER — Telehealth (HOSPITAL_COMMUNITY): Payer: Self-pay | Admitting: *Deleted

## 2018-11-23 NOTE — Telephone Encounter (Signed)
Preadmission screen  

## 2018-11-24 LAB — GC/CHLAMYDIA PROBE AMP
CHLAMYDIA, DNA PROBE: NEGATIVE
Neisseria gonorrhoeae by PCR: NEGATIVE

## 2018-11-24 LAB — STREP GP B NAA: Strep Gp B NAA: NEGATIVE

## 2018-11-25 ENCOUNTER — Inpatient Hospital Stay (HOSPITAL_COMMUNITY): Payer: Medicaid Other | Admitting: Certified Registered Nurse Anesthetist

## 2018-11-25 ENCOUNTER — Encounter (HOSPITAL_COMMUNITY)
Admission: AD | Disposition: A | Discharge status: Home / Self Care | Payer: Self-pay | Source: Home / Self Care | Attending: Obstetrics and Gynecology

## 2018-11-25 ENCOUNTER — Other Ambulatory Visit: Payer: Self-pay

## 2018-11-25 ENCOUNTER — Inpatient Hospital Stay (HOSPITAL_COMMUNITY)
Admission: AD | Admit: 2018-11-25 | Discharge: 2018-11-28 | DRG: 787 | Disposition: A | Discharge status: Home / Self Care | Payer: Medicaid Other | Attending: Obstetrics and Gynecology | Admitting: Obstetrics and Gynecology

## 2018-11-25 ENCOUNTER — Encounter (HOSPITAL_COMMUNITY): Payer: Self-pay | Admitting: *Deleted

## 2018-11-25 ENCOUNTER — Inpatient Hospital Stay (HOSPITAL_COMMUNITY): Admission: RE | Admit: 2018-11-25 | Payer: Self-pay | Source: Ambulatory Visit

## 2018-11-25 DIAGNOSIS — O99019 Anemia complicating pregnancy, unspecified trimester: Secondary | ICD-10-CM | POA: Diagnosis present

## 2018-11-25 DIAGNOSIS — O365930 Maternal care for other known or suspected poor fetal growth, third trimester, not applicable or unspecified: Secondary | ICD-10-CM | POA: Diagnosis present

## 2018-11-25 DIAGNOSIS — O42013 Preterm premature rupture of membranes, onset of labor within 24 hours of rupture, third trimester: Secondary | ICD-10-CM

## 2018-11-25 DIAGNOSIS — Z6791 Unspecified blood type, Rh negative: Secondary | ICD-10-CM

## 2018-11-25 DIAGNOSIS — O99324 Drug use complicating childbirth: Secondary | ICD-10-CM | POA: Diagnosis present

## 2018-11-25 DIAGNOSIS — O4290 Premature rupture of membranes, unspecified as to length of time between rupture and onset of labor, unspecified weeks of gestation: Secondary | ICD-10-CM | POA: Diagnosis present

## 2018-11-25 DIAGNOSIS — F17200 Nicotine dependence, unspecified, uncomplicated: Secondary | ICD-10-CM

## 2018-11-25 DIAGNOSIS — O365990 Maternal care for other known or suspected poor fetal growth, unspecified trimester, not applicable or unspecified: Secondary | ICD-10-CM | POA: Diagnosis present

## 2018-11-25 DIAGNOSIS — O99892 Other specified diseases and conditions complicating childbirth: Secondary | ICD-10-CM | POA: Diagnosis not present

## 2018-11-25 DIAGNOSIS — F17210 Nicotine dependence, cigarettes, uncomplicated: Secondary | ICD-10-CM | POA: Diagnosis present

## 2018-11-25 DIAGNOSIS — F1110 Opioid abuse, uncomplicated: Secondary | ICD-10-CM

## 2018-11-25 DIAGNOSIS — D649 Anemia, unspecified: Secondary | ICD-10-CM | POA: Diagnosis present

## 2018-11-25 DIAGNOSIS — O99334 Smoking (tobacco) complicating childbirth: Secondary | ICD-10-CM | POA: Diagnosis present

## 2018-11-25 DIAGNOSIS — O368130 Decreased fetal movements, third trimester, not applicable or unspecified: Secondary | ICD-10-CM | POA: Diagnosis present

## 2018-11-25 DIAGNOSIS — Z3A36 36 weeks gestation of pregnancy: Secondary | ICD-10-CM

## 2018-11-25 DIAGNOSIS — O4292 Full-term premature rupture of membranes, unspecified as to length of time between rupture and onset of labor: Secondary | ICD-10-CM | POA: Diagnosis present

## 2018-11-25 DIAGNOSIS — Z9189 Other specified personal risk factors, not elsewhere classified: Secondary | ICD-10-CM

## 2018-11-25 DIAGNOSIS — O9902 Anemia complicating childbirth: Secondary | ICD-10-CM | POA: Diagnosis present

## 2018-11-25 DIAGNOSIS — O26893 Other specified pregnancy related conditions, third trimester: Secondary | ICD-10-CM | POA: Diagnosis present

## 2018-11-25 DIAGNOSIS — F1120 Opioid dependence, uncomplicated: Secondary | ICD-10-CM | POA: Diagnosis present

## 2018-11-25 HISTORY — DX: Opioid abuse, uncomplicated: F11.10

## 2018-11-25 LAB — CBC
HCT: 31.5 % — ABNORMAL LOW (ref 36.0–46.0)
HCT: 32.2 % — ABNORMAL LOW (ref 36.0–46.0)
Hemoglobin: 10.4 g/dL — ABNORMAL LOW (ref 12.0–15.0)
Hemoglobin: 10.8 g/dL — ABNORMAL LOW (ref 12.0–15.0)
MCH: 28 pg (ref 26.0–34.0)
MCH: 27.6 pg (ref 26.0–34.0)
MCHC: 33 g/dL (ref 30.0–36.0)
MCHC: 33.5 g/dL (ref 30.0–36.0)
MCV: 84.7 fL (ref 80.0–100.0)
MCV: 82.1 fL (ref 80.0–100.0)
Platelets: 288 10*3/uL (ref 150–400)
Platelets: 254 10*3/uL (ref 150–400)
RBC: 3.72 MIL/uL — ABNORMAL LOW (ref 3.87–5.11)
RBC: 3.92 MIL/uL (ref 3.87–5.11)
RDW: 12.9 % (ref 11.5–15.5)
RDW: 12.7 % (ref 11.5–15.5)
WBC: 12.5 10*3/uL — ABNORMAL HIGH (ref 4.0–10.5)
WBC: 12.9 10*3/uL — ABNORMAL HIGH (ref 4.0–10.5)
nRBC: 0 % (ref 0.0–0.2)
nRBC: 0 % (ref 0.0–0.2)

## 2018-11-25 LAB — CREATININE, SERUM
Creatinine, Ser: 0.62 mg/dL (ref 0.44–1.00)
GFR calc Af Amer: >60 mL/min (ref >60)
GFR calc non Af Amer: >60 mL/min (ref >60)

## 2018-11-25 LAB — TYPE AND SCREEN
ABO/RH(D): A POS
Antibody Screen: NEGATIVE

## 2018-11-25 LAB — ABO/RH: ABO/RH(D): A POS

## 2018-11-25 SURGERY — Surgical Case
Anesthesia: General | Wound class: Clean Contaminated

## 2018-11-25 MED ORDER — OXYTOCIN 40 UNITS IN NORMAL SALINE INFUSION - SIMPLE MED: 2.5000 [IU]/h | INTRAVENOUS | Status: DC

## 2018-11-25 MED ORDER — SODIUM CHLORIDE 0.9 % IV SOLN
INTRAVENOUS | Status: DC | PRN
  Administered 2018-11-25: 14:00:00 via INTRAVENOUS

## 2018-11-25 MED ORDER — OXYTOCIN 40 UNITS IN NORMAL SALINE INFUSION - SIMPLE MED
INTRAVENOUS | Status: AC
  Filled 2018-11-25: qty 1000

## 2018-11-25 MED ORDER — LIDOCAINE HCL (PF) 1 % IJ SOLN: 30.0000 mL | INTRAMUSCULAR | Status: DC | PRN

## 2018-11-25 MED ORDER — BUPRENORPHINE HCL-NALOXONE HCL 8-2 MG SL SUBL: 1.0000 | SUBLINGUAL_TABLET | SUBLINGUAL | Status: DC

## 2018-11-25 MED ORDER — OXYCODONE HCL 5 MG/5ML PO SOLN: 5.0000 mg | Freq: Once | ORAL | Status: DC | PRN

## 2018-11-25 MED ORDER — SUCCINYLCHOLINE CHLORIDE 20 MG/ML IJ SOLN
INTRAMUSCULAR | Status: DC | PRN
  Administered 2018-11-25: 120 mg via INTRAVENOUS

## 2018-11-25 MED ORDER — PRENATAL MULTIVITAMIN CH
1.0000 | ORAL_TABLET | Freq: Every day | ORAL | Status: DC
  Administered 2018-11-26 – 2018-11-27 (×2): 1 via ORAL
  Filled 2018-11-25 (×2): qty 1

## 2018-11-25 MED ORDER — OXYTOCIN 40 UNITS IN NORMAL SALINE INFUSION - SIMPLE MED: 2.5000 [IU]/h | INTRAVENOUS | Status: AC

## 2018-11-25 MED ORDER — PROPOFOL 10 MG/ML IV BOLUS
INTRAVENOUS | Status: DC | PRN
  Administered 2018-11-25: 200 mg via INTRAVENOUS
  Administered 2018-11-25: 50 mg via INTRAVENOUS

## 2018-11-25 MED ORDER — HYDROMORPHONE HCL 1 MG/ML IJ SOLN
0.2500 mg | INTRAMUSCULAR | Status: DC | PRN
  Administered 2018-11-25: 0.5 mg via INTRAVENOUS
  Administered 2018-11-25 (×2): 0.25 mg via INTRAVENOUS
  Administered 2018-11-25 (×2): 0.5 mg via INTRAVENOUS

## 2018-11-25 MED ORDER — PROPOFOL 10 MG/ML IV BOLUS
INTRAVENOUS | Status: AC
  Filled 2018-11-25: qty 20

## 2018-11-25 MED ORDER — DIPHENHYDRAMINE HCL 25 MG PO CAPS: 25.0000 mg | ORAL_CAPSULE | Freq: Four times a day (QID) | ORAL | Status: DC | PRN

## 2018-11-25 MED ORDER — DIBUCAINE 1 % RE OINT: 1.0000 "application " | TOPICAL_OINTMENT | RECTAL | Status: DC | PRN

## 2018-11-25 MED ORDER — CEFAZOLIN SODIUM-DEXTROSE 2-3 GM-%(50ML) IV SOLR
INTRAVENOUS | Status: DC | PRN
  Administered 2018-11-25: 2 g via INTRAVENOUS

## 2018-11-25 MED ORDER — MIDAZOLAM HCL 2 MG/2ML IJ SOLN
INTRAMUSCULAR | Status: DC | PRN
  Administered 2018-11-25: 2 mg via INTRAVENOUS

## 2018-11-25 MED ORDER — BUPRENORPHINE HCL-NALOXONE HCL 8-2 MG SL SUBL
1.0000 | SUBLINGUAL_TABLET | SUBLINGUAL | Status: DC
  Administered 2018-11-25 – 2018-11-28 (×9): 1 via SUBLINGUAL
  Filled 2018-11-25 (×9): qty 1

## 2018-11-25 MED ORDER — MEPERIDINE HCL 25 MG/ML IJ SOLN
6.2500 mg | INTRAMUSCULAR | Status: DC | PRN
  Administered 2018-11-25: 12.5 mg via INTRAVENOUS
  Administered 2018-11-25 (×2): 6.25 mg via INTRAVENOUS

## 2018-11-25 MED ORDER — GABAPENTIN 100 MG PO CAPS
100.0000 mg | ORAL_CAPSULE | Freq: Three times a day (TID) | ORAL | Status: DC
  Administered 2018-11-25 – 2018-11-27 (×8): 100 mg via ORAL
  Filled 2018-11-25 (×8): qty 1

## 2018-11-25 MED ORDER — TETANUS-DIPHTH-ACELL PERTUSSIS 5-2.5-18.5 LF-MCG/0.5 IM SUSP: 0.5000 mL | Freq: Once | INTRAMUSCULAR | Status: DC

## 2018-11-25 MED ORDER — OXYTOCIN 10 UNIT/ML IJ SOLN
INTRAMUSCULAR | Status: DC | PRN
  Administered 2018-11-25: 40 [IU] via INTRAMUSCULAR

## 2018-11-25 MED ORDER — SIMETHICONE 80 MG PO CHEW: 80.0000 mg | CHEWABLE_TABLET | ORAL | Status: DC | PRN

## 2018-11-25 MED ORDER — WITCH HAZEL-GLYCERIN EX PADS: 1.0000 "application " | MEDICATED_PAD | CUTANEOUS | Status: DC | PRN

## 2018-11-25 MED ORDER — ENOXAPARIN SODIUM 40 MG/0.4ML ~~LOC~~ SOLN
40.0000 mg | SUBCUTANEOUS | Status: DC
  Administered 2018-11-26 – 2018-11-28 (×3): 40 mg via SUBCUTANEOUS
  Filled 2018-11-25 (×6): qty 0.4

## 2018-11-25 MED ORDER — ONDANSETRON HCL 4 MG/2ML IJ SOLN: 4.0000 mg | Freq: Four times a day (QID) | INTRAMUSCULAR | Status: DC | PRN

## 2018-11-25 MED ORDER — LACTATED RINGERS IV SOLN: 500.0000 mL | INTRAVENOUS | Status: DC | PRN

## 2018-11-25 MED ORDER — OXYCODONE HCL 5 MG PO TABS: 5.0000 mg | ORAL_TABLET | Freq: Once | ORAL | Status: DC | PRN

## 2018-11-25 MED ORDER — ONDANSETRON HCL 4 MG/2ML IJ SOLN
INTRAMUSCULAR | Status: DC | PRN
  Administered 2018-11-25: 4 mg via INTRAVENOUS

## 2018-11-25 MED ORDER — LACTATED RINGERS IV SOLN
INTRAVENOUS | Status: DC
  Administered 2018-11-25: 14:00:00 via INTRAVENOUS

## 2018-11-25 MED ORDER — FENTANYL CITRATE (PF) 100 MCG/2ML IJ SOLN
INTRAMUSCULAR | Status: DC | PRN
  Administered 2018-11-25: 150 ug via INTRAVENOUS
  Administered 2018-11-25: 100 ug via INTRAVENOUS

## 2018-11-25 MED ORDER — SENNOSIDES-DOCUSATE SODIUM 8.6-50 MG PO TABS
2.0000 | ORAL_TABLET | ORAL | Status: DC
  Administered 2018-11-25 – 2018-11-27 (×3): 2 via ORAL
  Filled 2018-11-25 (×3): qty 2

## 2018-11-25 MED ORDER — OXYCODONE-ACETAMINOPHEN 5-325 MG PO TABS: 2.0000 | ORAL_TABLET | ORAL | Status: DC | PRN

## 2018-11-25 MED ORDER — MIDAZOLAM HCL 2 MG/2ML IJ SOLN
INTRAMUSCULAR | Status: AC
  Filled 2018-11-25: qty 2

## 2018-11-25 MED ORDER — ONDANSETRON HCL 4 MG/2ML IJ SOLN
INTRAMUSCULAR | Status: AC
  Filled 2018-11-25: qty 2

## 2018-11-25 MED ORDER — CEFAZOLIN SODIUM-DEXTROSE 2-4 GM/100ML-% IV SOLN
INTRAVENOUS | Status: AC
  Filled 2018-11-25: qty 100

## 2018-11-25 MED ORDER — FENTANYL CITRATE (PF) 250 MCG/5ML IJ SOLN
INTRAMUSCULAR | Status: AC
  Filled 2018-11-25: qty 5

## 2018-11-25 MED ORDER — ACETAMINOPHEN 325 MG PO TABS: 650.0000 mg | ORAL_TABLET | ORAL | Status: DC | PRN

## 2018-11-25 MED ORDER — HYDROMORPHONE HCL 1 MG/ML IJ SOLN
INTRAMUSCULAR | Status: AC
  Filled 2018-11-25: qty 1

## 2018-11-25 MED ORDER — OXYCODONE HCL 5 MG PO TABS: 5.0000 mg | ORAL_TABLET | ORAL | Status: DC | PRN

## 2018-11-25 MED ORDER — MENTHOL 3 MG MT LOZG: 1.0000 | LOZENGE | OROMUCOSAL | Status: DC | PRN

## 2018-11-25 MED ORDER — TERBUTALINE SULFATE 1 MG/ML IJ SOLN
INTRAMUSCULAR | Status: AC
  Filled 2018-11-25: qty 1

## 2018-11-25 MED ORDER — IBUPROFEN 800 MG PO TABS
800.0000 mg | ORAL_TABLET | Freq: Three times a day (TID) | ORAL | Status: DC
  Administered 2018-11-25 – 2018-11-28 (×8): 800 mg via ORAL
  Filled 2018-11-25 (×8): qty 1

## 2018-11-25 MED ORDER — DEXAMETHASONE SODIUM PHOSPHATE 4 MG/ML IJ SOLN
INTRAMUSCULAR | Status: AC
  Filled 2018-11-25: qty 1

## 2018-11-25 MED ORDER — OXYCODONE-ACETAMINOPHEN 5-325 MG PO TABS: 1.0000 | ORAL_TABLET | ORAL | Status: DC | PRN

## 2018-11-25 MED ORDER — SIMETHICONE 80 MG PO CHEW
80.0000 mg | CHEWABLE_TABLET | ORAL | Status: DC
  Administered 2018-11-25 – 2018-11-27 (×3): 80 mg via ORAL
  Filled 2018-11-25 (×3): qty 1

## 2018-11-25 MED ORDER — PROMETHAZINE HCL 25 MG/ML IJ SOLN: 6.2500 mg | INTRAMUSCULAR | Status: DC | PRN

## 2018-11-25 MED ORDER — SIMETHICONE 80 MG PO CHEW
80.0000 mg | CHEWABLE_TABLET | Freq: Three times a day (TID) | ORAL | Status: DC
  Administered 2018-11-25 – 2018-11-28 (×8): 80 mg via ORAL
  Filled 2018-11-25 (×8): qty 1

## 2018-11-25 MED ORDER — KETOROLAC TROMETHAMINE 30 MG/ML IJ SOLN
INTRAMUSCULAR | Status: AC
  Filled 2018-11-25: qty 1

## 2018-11-25 MED ORDER — KETOROLAC TROMETHAMINE 30 MG/ML IJ SOLN
30.0000 mg | Freq: Once | INTRAMUSCULAR | Status: AC | PRN
  Administered 2018-11-25: 30 mg via INTRAVENOUS

## 2018-11-25 MED ORDER — COCONUT OIL OIL: 1.0000 "application " | TOPICAL_OIL | Status: DC | PRN

## 2018-11-25 MED ORDER — DEXAMETHASONE SODIUM PHOSPHATE 10 MG/ML IJ SOLN
INTRAMUSCULAR | Status: DC | PRN
  Administered 2018-11-25: 4 mg via INTRAVENOUS

## 2018-11-25 MED ORDER — OXYTOCIN BOLUS FROM INFUSION: 500.0000 mL | Freq: Once | INTRAVENOUS | Status: DC

## 2018-11-25 MED ORDER — SUCCINYLCHOLINE CHLORIDE 200 MG/10ML IV SOSY
PREFILLED_SYRINGE | INTRAVENOUS | Status: AC
  Filled 2018-11-25: qty 10

## 2018-11-25 MED ORDER — MEPERIDINE HCL 25 MG/ML IJ SOLN
INTRAMUSCULAR | Status: AC
  Filled 2018-11-25: qty 1

## 2018-11-25 MED ORDER — LACTATED RINGERS IV SOLN
INTRAVENOUS | Status: DC
  Administered 2018-11-25: 22:00:00 via INTRAVENOUS

## 2018-11-25 MED ORDER — SOD CITRATE-CITRIC ACID 500-334 MG/5ML PO SOLN
30.0000 mL | ORAL | Status: DC | PRN
  Administered 2018-11-25: 30 mL via ORAL
  Filled 2018-11-25: qty 15

## 2018-11-25 SURGICAL SUPPLY — 29 items
BENZOIN TINCTURE PRP APPL 2/3 (GAUZE/BANDAGES/DRESSINGS) ×3 IMPLANT
CHLORAPREP W/TINT 26ML (MISCELLANEOUS) ×3 IMPLANT
CLAMP CORD UMBIL (MISCELLANEOUS) IMPLANT
CLOSURE STERI STRIP 1/2 X4 (GAUZE/BANDAGES/DRESSINGS) ×3 IMPLANT
DRSG OPSITE POSTOP 4X10 (GAUZE/BANDAGES/DRESSINGS) ×3 IMPLANT
ELECT REM PT RETURN 9FT ADLT (ELECTROSURGICAL) ×3
ELECTRODE REM PT RTRN 9FT ADLT (ELECTROSURGICAL) ×1 IMPLANT
EXTRACTOR VACUUM M CUP 4 TUBE (SUCTIONS) IMPLANT
EXTRACTOR VACUUM M CUP 4' TUBE (SUCTIONS)
GLOVE BIOGEL PI IND STRL 6.5 (GLOVE) ×1 IMPLANT
GLOVE BIOGEL PI IND STRL 7.0 (GLOVE) ×1 IMPLANT
GLOVE BIOGEL PI INDICATOR 6.5 (GLOVE) ×2
GLOVE BIOGEL PI INDICATOR 7.0 (GLOVE) ×2
GLOVE SURG SS PI 6.0 STRL IVOR (GLOVE) ×3 IMPLANT
GOWN STRL REUS W/TWL LRG LVL3 (GOWN DISPOSABLE) ×6 IMPLANT
KIT ABG SYR 3ML LUER SLIP (SYRINGE) IMPLANT
NEEDLE HYPO 25X5/8 SAFETYGLIDE (NEEDLE) IMPLANT
NS IRRIG 1000ML POUR BTL (IV SOLUTION) ×3 IMPLANT
PACK C SECTION WH (CUSTOM PROCEDURE TRAY) ×3 IMPLANT
PAD OB MATERNITY 4.3X12.25 (PERSONAL CARE ITEMS) ×3 IMPLANT
PENCIL SMOKE EVAC W/HOLSTER (ELECTROSURGICAL) ×3 IMPLANT
RTRCTR C-SECT PINK 25CM LRG (MISCELLANEOUS) IMPLANT
SEPRAFILM MEMBRANE 5X6 (MISCELLANEOUS) IMPLANT
SUT PLAIN 0 NONE (SUTURE) IMPLANT
SUT VIC AB 0 CT1 36 (SUTURE) ×12 IMPLANT
SUT VIC AB 4-0 KS 27 (SUTURE) ×3 IMPLANT
TOWEL OR 17X24 6PK STRL BLUE (TOWEL DISPOSABLE) ×3 IMPLANT
TRAY FOLEY W/BAG SLVR 14FR LF (SET/KITS/TRAYS/PACK) ×3 IMPLANT
WATER STERILE IRR 1000ML POUR (IV SOLUTION) ×3 IMPLANT

## 2018-11-25 NOTE — H&P (Signed)
OBSTETRIC ADMISSION HISTORY AND PHYSICAL  Tracy Stevenson is a 38 y.o. female G2P1001 with IUP at [redacted]w[redacted]d by 13wk [place] presenting for ROM and decreased FM.   She received her prenatal care at [facility].  Support person in labor: ROB  Ultrasounds . Anatomy U/S: normal female  Prenatal History/Complications: . Fetal growth restriction, elevated cord dopplers  . Subutex use . Smoker . Rubella non-immune  Past Medical History: Past Medical History:  Diagnosis Date  . Opiate abuse, continuous (HCC)   . Vaginal Pap smear, abnormal     Past Surgical History: History reviewed. No pertinent surgical history.  Obstetrical History: OB History    Gravida  2   Para  1   Term  1   Preterm      AB      Living  1     SAB      TAB      Ectopic      Multiple      Live Births  1          Social History: Social History   Socioeconomic History  . Marital status: Legally Separated    Spouse name: Ernst Breach  . Number of children: 1  . Years of education: 29  . Highest education level: High school graduate  Occupational History  . Not on file  Social Needs  . Financial resource strain: Not very hard  . Food insecurity:    Worry: Never true    Inability: Never true  . Transportation needs:    Medical: No    Non-medical: No  Tobacco Use  . Smoking status: Current Every Day Smoker    Packs/day: 0.25    Types: Cigarettes  . Smokeless tobacco: Never Used  Substance and Sexual Activity  . Alcohol use: No  . Drug use: Not Currently    Types: Marijuana, Oxycodone, Other-see comments    Comment: subutex tid  . Sexual activity: Yes    Birth control/protection: None  Lifestyle  . Physical activity:    Days per week: 5 days    Minutes per session: 150+ min  . Stress: Only a little  Relationships  . Social connections:    Talks on phone: More than three times a week    Gets together: Three times a week    Attends religious service: Never    Active member of  club or organization: No    Attends meetings of clubs or organizations: Never    Relationship status: Living with partner  Other Topics Concern  . Not on file  Social History Narrative  . Not on file   Family History: History reviewed. No pertinent family history.  Allergies: No Known Allergies  Medications Prior to Admission  Medication Sig Dispense Refill Last Dose  . buprenorphine-naloxone (SUBOXONE) 8-2 mg SUBL SL tablet Place 1 tablet under the tongue 3 (three) times daily.    11/25/2018 at Unknown time  . cephALEXin (KEFLEX) 500 MG capsule Take 1 capsule (500 mg total) by mouth 4 (four) times daily. X 7 days 28 capsule 0 11/25/2018 at Unknown time  . ondansetron (ZOFRAN) 4 MG tablet Take 1 tablet (4 mg total) by mouth every 8 (eight) hours as needed for nausea or vomiting. 20 tablet 0 Past Week at Unknown time  . Prenatal Vit-Fe Fumarate-FA (PRENATAL VITAMIN) 27-0.8 MG TABS Take 1 tablet by mouth daily. 30 tablet 3 11/25/2018 at Unknown time  . cyclobenzaprine (FLEXERIL) 10 MG tablet Take 1 tablet (10  mg total) by mouth every 8 (eight) hours as needed for muscle spasms. (Patient not taking: Reported on 11/03/2018) 20 tablet 0 Not Taking  . famotidine (PEPCID) 20 MG tablet Take 1 tablet (20 mg total) by mouth at bedtime. 30 tablet 1 Taking  . metroNIDAZOLE (FLAGYL) 500 MG tablet Take 1 tablet (500 mg total) by mouth 2 (two) times daily. 14 tablet 0 Taking   Review of Systems  All systems reviewed and negative except as stated in HPI  Blood pressure 131/73, pulse 71, temperature (!) 97.2 F (36.2 C), temperature source Oral, resp. rate 18, height 5\' 9"  (1.753 m), weight 73.4 kg, SpO2 100 %. General appearance: alert, cooperative and no distress Lungs: no respiratory distress Heart: regular rate  Abdomen: soft, non-tender; gravid b Pelvic: 2/60/-2, vtx Extremities: Homans sign is negative, no sign of DVT Presentation: cephalic Fetal monitoring: baseline 150, min variability, no  accels, +late, prolonged Uterine activity: rare   Prenatal labs: ABO, Rh: A/Positive/-- (11/19 1215) Antibody: Negative (01/23 0844) Rubella: <0.90 (11/19 1215) RPR: Non Reactive (01/23 0844)  HBsAg: Negative (11/19 1215)  HIV: Non Reactive (01/23 0844)  GBS: Negative (03/24 1245)  Glucola: normal Genetic screening:  normal  Prenatal Transfer Tool  Maternal Diabetes: No Genetic Screening: Normal Maternal Ultrasounds/Referrals: Normal Fetal Ultrasounds or other Referrals:  None Maternal Substance Abuse:  Yes:  Type: Smoker Significant Maternal Medications:  Meds include: Other: Subutex Significant Maternal Lab Results: Lab values include: Rh negative  Results for orders placed or performed during the hospital encounter of 11/25/18 (from the past 24 hour(s))  CBC   Collection Time: 11/25/18 12:17 PM  Result Value Ref Range   WBC 12.5 (H) 4.0 - 10.5 K/uL   RBC 3.72 (L) 3.87 - 5.11 MIL/uL   Hemoglobin 10.4 (L) 12.0 - 15.0 g/dL   HCT 16.1 (L) 09.6 - 04.5 %   MCV 84.7 80.0 - 100.0 fL   MCH 28.0 26.0 - 34.0 pg   MCHC 33.0 30.0 - 36.0 g/dL   RDW 40.9 81.1 - 91.4 %   Platelets 288 150 - 400 K/uL   nRBC 0.0 0.0 - 0.2 %    Patient Active Problem List   Diagnosis Date Noted  . Premature rupture of membranes 11/25/2018  . IUGR (intrauterine growth restriction) affecting care of mother 11/06/2018  . Pregnancy complicated by subutex maintenance, antepartum (HCC) 09/22/2018  . UTI (urinary tract infection) during pregnancy, second trimester 09/22/2018  . Smoker 07/19/2018  . Opiate addiction (HCC) 07/19/2018  . Rubella non-immune status, antepartum 06/17/2018  . Supervision of high risk pregnancy, antepartum 06/16/2018   Assessment/Plan:  36.[redacted] weeks gestation PROM EFM: Cat III Admit to LD>plan for OR Mngt per Dr. Henry Russel, Kahlee Metivier, CNM  11/25/2018 1:47 PM

## 2018-11-25 NOTE — Anesthesia Postprocedure Evaluation (Signed)
Anesthesia Post Note  Patient: Tracy Stevenson  Procedure(s) Performed: CESAREAN SECTION (N/A )     Patient location during evaluation: PACU Anesthesia Type: General Level of consciousness: awake and alert Pain management: pain level controlled Vital Signs Assessment: post-procedure vital signs reviewed and stable Respiratory status: spontaneous breathing, nonlabored ventilation and respiratory function stable Cardiovascular status: blood pressure returned to baseline and stable Postop Assessment: no apparent nausea or vomiting Anesthetic complications: no    Last Vitals:  Vitals:   11/25/18 1600 11/25/18 1615  BP: 124/73 121/69  Pulse: (!) 55 66  Resp: 12 12  Temp: 36.5 C   SpO2: 98% 99%    Last Pain:  Vitals:   11/25/18 1600  TempSrc: Axillary  PainSc: 4    Pain Goal:                   Lowella Curb

## 2018-11-25 NOTE — Transfer of Care (Signed)
Immediate Anesthesia Transfer of Care Note  Patient: Tracy Stevenson  Procedure(s) Performed: CESAREAN SECTION (N/A )  Patient Location: PACU  Anesthesia Type:General  Level of Consciousness: awake, alert  and oriented  Airway & Oxygen Therapy: Patient Spontanous Breathing and Patient connected to nasal cannula oxygen  Post-op Assessment: Report given to RN, Post -op Vital signs reviewed and stable and Patient moving all extremities  Post vital signs: Reviewed and stable  Last Vitals:  Vitals Value Taken Time  BP 132/69 11/25/2018  2:39 PM  Temp    Pulse 73 11/25/2018  2:44 PM  Resp 21 11/25/2018  2:44 PM  SpO2 93 % 11/25/2018  2:44 PM  Vitals shown include unvalidated device data.  Last Pain:  Vitals:   11/25/18 1328  TempSrc:   PainSc: 0-No pain         Complications: No apparent anesthesia complications

## 2018-11-25 NOTE — Anesthesia Procedure Notes (Signed)
Performed by: Elgie Congo, CRNA

## 2018-11-25 NOTE — Anesthesia Procedure Notes (Signed)
Procedure Name: Intubation Date/Time: 11/25/2018 1:49 PM Performed by: Hewitt Blade, CRNA Pre-anesthesia Checklist: Patient identified, Emergency Drugs available, Suction available and Patient being monitored Patient Re-evaluated:Patient Re-evaluated prior to induction Oxygen Delivery Method: Circle system utilized Preoxygenation: Pre-oxygenation with 100% oxygen Induction Type: IV induction Ventilation: Mask ventilation without difficulty Laryngoscope Size: Mac and 3 Grade View: Grade I Tube type: Oral Number of attempts: 1 (Dr. Sabra Heck) Airway Equipment and Method: Stylet and Oral airway Placement Confirmation: ETT inserted through vocal cords under direct vision,  positive ETCO2 and breath sounds checked- equal and bilateral Secured at: 21 cm Tube secured with: Tape Dental Injury: Teeth and Oropharynx as per pre-operative assessment

## 2018-11-25 NOTE — Anesthesia Preprocedure Evaluation (Signed)
Anesthesia Evaluation  Patient identified by MRN, date of birth, ID band Patient awake    Reviewed: Allergy & Precautions, NPO status , Patient's Chart, lab work & pertinent test results  Airway Mallampati: II  TM Distance: >3 FB Neck ROM: Full    Dental no notable dental hx.    Pulmonary neg pulmonary ROS, Current Smoker,    Pulmonary exam normal breath sounds clear to auscultation       Cardiovascular negative cardio ROS Normal cardiovascular exam Rhythm:Regular Rate:Normal     Neuro/Psych negative neurological ROS  negative psych ROS   GI/Hepatic negative GI ROS, Neg liver ROS,   Endo/Other  negative endocrine ROS  Renal/GU negative Renal ROS  negative genitourinary   Musculoskeletal negative musculoskeletal ROS (+)   Abdominal   Peds negative pediatric ROS (+)  Hematology negative hematology ROS (+)   Anesthesia Other Findings   Reproductive/Obstetrics (+) Pregnancy                             Anesthesia Physical Anesthesia Plan  ASA: II and emergent  Anesthesia Plan: General   Post-op Pain Management:    Induction: Intravenous, Rapid sequence and Cricoid pressure planned  PONV Risk Score and Plan: 2 and Ondansetron and Midazolam  Airway Management Planned: Oral ETT  Additional Equipment:   Intra-op Plan:   Post-operative Plan: Extubation in OR  Informed Consent: I have reviewed the patients History and Physical, chart, labs and discussed the procedure including the risks, benefits and alternatives for the proposed anesthesia with the patient or authorized representative who has indicated his/her understanding and acceptance.     Dental advisory given  Plan Discussed with: CRNA  Anesthesia Plan Comments: (Stat C-section for fetal heart rate)        Anesthesia Quick Evaluation

## 2018-11-25 NOTE — MAU Note (Addendum)
Pt presents to MAU with complaints of ROM at 2300 last night. States that she is supposed to be induced on 11/28/18 for abnormal dopplers on U/S. DFM today

## 2018-11-25 NOTE — Op Note (Addendum)
Tracy Stevenson PROCEDURE DATE: 11/25/2018  PREOPERATIVE DIAGNOSIS: Intrauterine pregnancy at  [redacted]w[redacted]d weeks gestation; non-reassuring fetal status  POSTOPERATIVE DIAGNOSIS: The same  PROCEDURE:     Cesarean Section  SURGEON:  Dr. Gigi Gin Johnchristopher Sarvis  ASSISTANT: none  INDICATIONS: Tracy Stevenson is a 38 y.o. G2P1001 at [redacted]w[redacted]d scheduled for cesarean section secondary to non-reassuring fetal status.  The risks of cesarean section discussed with the patient included but were not limited to: bleeding which may require transfusion or reoperation; infection which may require antibiotics; injury to bowel, bladder, ureters or other surrounding organs; injury to the fetus; need for additional procedures including hysterectomy in the event of a life-threatening hemorrhage; placental abnormalities wth subsequent pregnancies, incisional problems, thromboembolic phenomenon and other postoperative/anesthesia complications. The patient concurred with the proposed plan, giving informed written consent for the procedure.    FINDINGS:  Viable female infant in cephalic presentation.  Apgars 5 and 9.  Clear amniotic fluid.  Intact placenta, three vessel cord.  Normal uterus, fallopian tubes and ovaries bilaterally.  ANESTHESIA:    General INTRAVENOUS FLUIDS:1400 ml ESTIMATED BLOOD LOSS: 114 mL ml URINE OUTPUT:  100 ml SPECIMENS: Placenta sent to pathology COMPLICATIONS: None immediate  PROCEDURE IN DETAIL:  The patient received intravenous antibiotics and had sequential compression devices applied to her lower extremities while in the preoperative area.  She was then taken to the operating room where anesthesia was induced and was found to be adequate. A foley catheter was placed into her bladder and attached to Tracy Stevenson gravity. She was then placed in a dorsal supine position with a leftward tilt, and prepped and draped in a sterile manner. After an adequate timeout was performed, a Pfannenstiel skin incision was made  with scalpel and carried through to the underlying layer of fascia. The fascia was incised in the midline and this incision was extended bilaterally using the Mayo scissors. Kocher clamps were applied to the superior aspect of the fascial incision and the underlying rectus muscles were dissected off bluntly. A similar process was carried out on the inferior aspect of the facial incision. The rectus muscles were separated in the midline bluntly and the peritoneum was entered bluntly. The Alexis self-retaining retractor was introduced into the abdominal cavity. Attention was turned to the lower uterine segment where a transverse hysterotomy was made with a scalpel and extended bilaterally bluntly. The infant was successfully delivered and delayed cord clamping was performed for 1 minute. The cord was clamped and cut and infant was handed over to awaiting neonatology team. Uterine massage was then administered and the placenta delivered intact with three-vessel cord. The uterus was cleared of clot and debris.  The hysterotomy was closed with 0 Vicryl in a running locked fashion, and an imbricating layer was also placed with a 0 Vicryl. Overall, excellent hemostasis was noted. The pelvis copiously irrigated and cleared of all clot and debris. Hemostasis was confirmed on all surfaces.  The peritoneum and the muscles were reapproximated using 0 vicryl interrupted stitches. The fascia was then closed using 0 Vicryl in a running fashion.  The skin was closed in a subcuticular fashion using 3.0 Vicryl. The patient tolerated the procedure well. Sponge, lap, instrument and needle counts were correct x 2. She was taken to the recovery room in stable condition.    [name] ConstantMD  11/25/2018 2:23 PM

## 2018-11-26 LAB — CBC
HCT: 29.2 % — ABNORMAL LOW (ref 36.0–46.0)
Hemoglobin: 9.7 g/dL — ABNORMAL LOW (ref 12.0–15.0)
MCH: 27.7 pg (ref 26.0–34.0)
MCHC: 33.2 g/dL (ref 30.0–36.0)
MCV: 83.4 fL (ref 80.0–100.0)
Platelets: 220 10*3/uL (ref 150–400)
RBC: 3.5 MIL/uL — ABNORMAL LOW (ref 3.87–5.11)
RDW: 12.8 % (ref 11.5–15.5)
WBC: 15.7 10*3/uL — ABNORMAL HIGH (ref 4.0–10.5)
nRBC: 0 % (ref 0.0–0.2)

## 2018-11-26 LAB — RPR: RPR Ser Ql: NONREACTIVE

## 2018-11-26 MED ORDER — MEASLES, MUMPS & RUBELLA VAC IJ SOLR: 0.5000 mL | Freq: Once | INTRAMUSCULAR | Status: DC

## 2018-11-26 MED ORDER — EPHEDRINE 5 MG/ML INJ
INTRAVENOUS | Status: AC
  Filled 2018-11-26: qty 10

## 2018-11-26 MED ORDER — ACETAMINOPHEN 500 MG PO TABS
1000.0000 mg | ORAL_TABLET | Freq: Four times a day (QID) | ORAL | Status: DC
  Administered 2018-11-26 – 2018-11-28 (×9): 1000 mg via ORAL
  Filled 2018-11-26 (×9): qty 2

## 2018-11-26 MED ORDER — ALBUMIN HUMAN 5 % IV SOLN
INTRAVENOUS | Status: AC
  Filled 2018-11-26: qty 500

## 2018-11-26 MED ORDER — LIDOCAINE 2% (20 MG/ML) 5 ML SYRINGE
INTRAMUSCULAR | Status: AC
  Filled 2018-11-26: qty 5

## 2018-11-26 NOTE — Lactation Note (Signed)
This note was copied from a baby's chart. Lactation Consultation Note Baby 13 hrs old. Has no interest in BF or bottle feeding. Has no suck swallow coordination at this time. Baby in NICU, mom rooming in. Baby wt. 4.5 lbs 36 5/7 wks. Baby has NG tube as well as being bottle fed which he will not suck.  Hand expressed 5 ml orange colostrum via spoon. Baby needed jaws massaged to swallow. Baby has no fat pads in cheeks.  Suck training by stroking tongue to stimulate suckling, occasionally would try to suckle on spoon, but very short burst of times.   Mom has everted nipples. Taught hand expression, mom demonstrated. Mom shown how to use DEBP & how to disassemble, clean, & reassemble parts. Encouraged mom to pump q 3 hrs.  Mom taking Subutex. Discussed benefits if BF and BM to help baby w/withdrawls. Discussed importance of STS and BF.  Educated about newborn behavior, I&O, supply and demand.  Mom has 46 yr old that she didn't BF. Would like to BF this baby for 4-6 weeks if possible. Mom understands reasoning of supplementing.  Mom is going to sign up for [facility] after d/c home. LC filled out [facility] Referral and faxed. Lactation brochure left w/mom.  Patient Name: Tracy Stevenson Date: 11/26/2018 Reason for consult: Initial assessment;NICU baby;Late-preterm 34-36.6wks;Infant < 6lbs;1st time breastfeeding   Maternal Data Has patient been taught Hand Expression?: Yes Does the patient have breastfeeding experience prior to this delivery?: No  Feeding Feeding Type: Breast Milk Nipple Type: Slow - flow  LATCH Score       Type of Nipple: Everted at rest and after stimulation  Comfort (Breast/Nipple): Soft / non-tender        Interventions Interventions: DEBP;Breast massage;Expressed milk;Hand express;Breast compression  Lactation Tools Discussed/Used Tools: Pump Breast pump type: Double-Electric Breast Pump WIC Program: No(wants to sign up) Pump Review: Setup,  frequency, and cleaning;Milk Storage Initiated by:: Peri Jefferson RN IBCLC Date initiated:: 11/26/18   Consult Status Consult Status: Follow-up Date: 11/26/18 Follow-up type: In-patient    Charyl Dancer 11/26/2018, 3:40 AM

## 2018-11-26 NOTE — Progress Notes (Signed)
Post Op Day 1 Subjective: up ad lib, voiding, tolerating PO and + flatus Struggling with pain - would like to avoid narcotics  Objective: Blood pressure 115/75, pulse 61, temperature 98.7 F (37.1 C), temperature source Oral, resp. rate 20, height 5\' 9"  (1.753 m), weight 73.4 kg, SpO2 98 %.  Physical Exam:  General: alert, cooperative, appears stated age and no distress Lochia: appropriate Uterine Fundus: firm Incision: pressure dressing in place, c/d/i DVT Evaluation: No evidence of DVT seen on physical exam.  Recent Labs    11/25/18 1716 11/26/18 0724  HGB 10.8* 9.7*  HCT 32.2* 29.2*    Assessment/Plan: Plan for discharge tomorrow Will add scheduled tylenol for pain management   LOS: 1 day   Tracy Stevenson 11/26/2018, 8:06 AM

## 2018-11-26 NOTE — Anesthesia Postprocedure Evaluation (Signed)
Anesthesia Post Note  Patient: Sandrine Osberg  Procedure(s) Performed: CESAREAN SECTION (N/A )     Patient location during evaluation: Mother Baby Anesthesia Type: General Level of consciousness: awake, awake and alert and oriented Pain management: pain level controlled Vital Signs Assessment: post-procedure vital signs reviewed and stable Respiratory status: spontaneous breathing and respiratory function stable Cardiovascular status: blood pressure returned to baseline Postop Assessment: no apparent nausea or vomiting, adequate PO intake and able to ambulate Anesthetic complications: no    Last Vitals:  Vitals:   11/26/18 0637 11/26/18 0800  BP: 115/75 111/68  Pulse: 61 63  Resp: 20 18  Temp: 37.1 C 37 C  SpO2:  100%    Last Pain:  Vitals:   11/26/18 0800  TempSrc: Oral  PainSc:    Pain Goal: Patients Stated Pain Goal: 3 (11/25/18 1635)              Epidural/Spinal Function Cutaneous sensation: Normal sensation (11/26/18 0800), Patient able to flex knees: Yes (11/26/18 0800), Patient able to lift hips off bed: Yes (11/26/18 0800), Back pain beyond tenderness at insertion site: No (11/26/18 0800), Progressively worsening motor and/or sensory loss: No (11/26/18 0800), Bowel and/or bladder incontinence post epidural: No (11/26/18 0800)  Cleda Clarks

## 2018-11-26 NOTE — Addendum Note (Signed)
Addendum  created 11/26/18 9326 by Cleda Clarks, CRNA   Clinical Note Signed

## 2018-11-27 NOTE — Lactation Note (Signed)
This note was copied from a baby's chart. Lactation Consultation Note  Patient Name: Tracy Stevenson [id number]'[id number] Date: 11/27/2018 Reason for consult: Follow-up assessment;NICU baby  Mom was recently able to pump 15 mL. She reports having pumped about 5-6 times so far. Mom reports + breast changes w/pregnancy.   Mom has a clean toothbrush, 2 basins, & dishwashing liquid for washing pump parts. The hand-out from the CDC, "How to Keep Your Breast Pump Kit Clean," was provided to Mom. Mom is thinking of sanitizing her pump parts once home using the [employer] QuickSteam bags.   Matthias Hughs [facility] 11/27/2018, 12:10 PM

## 2018-11-27 NOTE — Progress Notes (Signed)
Post Op Day : 2 Subjective: no complaints, up ad lib, voiding, tolerating PO and + flatus Pain improving  Objective: Blood pressure 114/70, pulse 63, temperature 98.1 F (36.7 C), temperature source Oral, resp. rate 18, height 5\' 9"  (1.753 m), weight 73.4 kg, SpO2 99 %.  Physical Exam:  General: alert, cooperative, appears stated age and no distress Lochia: appropriate Uterine Fundus: firm Incision: healing well, no significant drainage, no dehiscence, no significant erythema DVT Evaluation: No evidence of DVT seen on physical exam.  Recent Labs    11/25/18 1716 11/26/18 0724  HGB 10.8* 9.7*  HCT 32.2* 29.2*    Assessment/Plan: Plan for discharge tomorrow   LOS: 2 days   Gwenevere Abbot 11/27/2018, 9:21 AM

## 2018-11-28 ENCOUNTER — Inpatient Hospital Stay (HOSPITAL_COMMUNITY): Payer: Medicaid Other

## 2018-11-28 DIAGNOSIS — O99019 Anemia complicating pregnancy, unspecified trimester: Secondary | ICD-10-CM | POA: Diagnosis present

## 2018-11-28 DIAGNOSIS — O99892 Other specified diseases and conditions complicating childbirth: Secondary | ICD-10-CM | POA: Diagnosis not present

## 2018-11-28 MED ORDER — IBUPROFEN 800 MG PO TABS: 800.0000 mg | ORAL_TABLET | Freq: Three times a day (TID) | ORAL | 0 refills | Status: DC

## 2018-11-28 NOTE — Discharge Summary (Signed)
Postpartum Discharge Summary     Patient Name: Tracy Stevenson DOB: [birth year] MRN: [id number]  Date of admission: 11/25/2018 Delivering Provider: CONSTANT, PEGGY   Date of discharge: 11/28/2018  Admitting diagnosis: 37wks leaking dfm Intrauterine pregnancy: [redacted]w[redacted]d     Secondary diagnosis:  Active Problems:   IUGR (intrauterine growth restriction) affecting care of mother   Premature rupture of membranes   Fetal heart rate decelerations, delivered   Delivery by emergency cesarean section   Anemia affecting pregnancy  Additional problems: None     Discharge diagnosis: Preterm Pregnancy Delivered and Anemia                                                                                                Post partum procedures: None  Augmentation: None  Complications: None  Hospital course:  Onset of Labor With Unplanned C/S  38 y.o. yo G2P1001 at [redacted]w[redacted]d was admitted in Latent Labor on 11/25/2018. Patient had a labor course significant for category III tracing with emergency c-section. Membrane Rupture Time/Date: 11:00 PM ,11/24/2018   The patient went for cesarean section due to Non-Reassuring FHR, and delivered a Viable infant,11/25/2018  Details of operation can be found in separate operative note. Patient had an uncomplicated postpartum course.  She is ambulating,tolerating a regular diet, passing flatus, and urinating well.  Patient is discharged home in stable condition 11/28/18.  Magnesium Sulfate recieved: No BMZ received: No  Physical exam  Vitals:   11/26/18 1230 11/26/18 2130 11/27/18 0514 11/27/18 1410  BP: 113/64 115/71 114/70 122/68  Pulse: 64 64 63 94  Resp: 18 18 18 18   Temp: 98.6 F (37 C) 98.3 F (36.8 C) 98.1 F (36.7 C) 99.2 F (37.3 C)  TempSrc: Oral Oral Oral Oral  SpO2: 100% 98% 99% 100%  Weight:      Height:       General: alert, cooperative and no distress Lochia: appropriate Uterine Fundus: firm Incision: Healing well with no significant  drainage, No significant erythema, Dressing is clean, dry, and intact DVT Evaluation: No evidence of DVT seen on physical exam. Labs: Lab Results  Component Value Date   WBC 15.7 (H) 11/26/2018   HGB 9.7 (L) 11/26/2018   HCT 29.2 (L) 11/26/2018   MCV 83.4 11/26/2018   PLT 220 11/26/2018   CMP Latest Ref Rng & Units 11/25/2018  Creatinine 0.44 - 1.00 mg/dL 3.38    Discharge instruction: per After Visit Summary and "Baby and Me Booklet".  After visit meds:  Allergies as of 11/28/2018   No Known Allergies     Medication List    STOP taking these medications   cephALEXin 500 MG capsule Commonly known as:  KEFLEX   metroNIDAZOLE 500 MG tablet Commonly known as:  FLAGYL     TAKE these medications   buprenorphine 2 MG Subl SL tablet Commonly known as:  SUBUTEX Place 2 mg under the tongue 3 (three) times daily.   cyclobenzaprine 10 MG tablet Commonly known as:  FLEXERIL Take 1 tablet (10 mg total) by mouth every 8 (eight) hours as needed for muscle spasms.  famotidine 20 MG tablet Commonly known as:  PEPCID Take 1 tablet (20 mg total) by mouth at bedtime.   ibuprofen 800 MG tablet Commonly known as:  ADVIL,MOTRIN Take 1 tablet (800 mg total) by mouth every 8 (eight) hours.   ondansetron 4 MG tablet Commonly known as:  ZOFRAN Take 1 tablet (4 mg total) by mouth every 8 (eight) hours as needed for nausea or vomiting.   Prenatal Vitamin 27-0.8 MG Tabs Take 1 tablet by mouth daily.       Diet: routine diet  Activity: Advance as tolerated. Pelvic rest for 6 weeks.   Outpatient follow up:2 weeks Follow up Appt: Future Appointments  Date Time Provider Department Center  12/09/2018 12:45 PM [facility] [place] 2 [facility]   Newborn Data: Live born female  Birth Weight: 4 lb 5.5 oz (1970 g) APGAR: 5, 9  Newborn Delivery   Birth date/time:  11/25/2018 13:51:00 Delivery type:  C-Section, Low Transverse C-section categorization:  Primary     Baby Feeding: Bottle  and Breast Disposition:NICU   11/28/2018 Gwenevere Abbot, MD

## 2018-11-29 ENCOUNTER — Other Ambulatory Visit: Payer: Self-pay

## 2018-11-29 ENCOUNTER — Other Ambulatory Visit: Payer: Self-pay | Admitting: Obstetrics & Gynecology

## 2018-11-30 ENCOUNTER — Encounter: Payer: Self-pay | Admitting: *Deleted

## 2018-12-01 ENCOUNTER — Telehealth: Payer: Self-pay | Admitting: *Deleted

## 2018-12-01 NOTE — Telephone Encounter (Signed)
LMOVM that we are not allowing visitors or children to come to appointments at this time. Advised to call if has had any contact with anyone suspected or confirmed of having COVID-19. Or if she has fever, cough, sob, muscle pain, diarrhea, rash, vomiting, abdominal pain, red eye, weakness, bruising or bleeding, joint pain or severe headache.

## 2018-12-02 ENCOUNTER — Encounter: Payer: Self-pay | Admitting: Obstetrics & Gynecology

## 2018-12-02 ENCOUNTER — Encounter: Payer: Self-pay | Admitting: Women's Health

## 2018-12-02 ENCOUNTER — Other Ambulatory Visit: Payer: Self-pay

## 2018-12-02 ENCOUNTER — Ambulatory Visit (INDEPENDENT_AMBULATORY_CARE_PROVIDER_SITE_OTHER): Payer: Medicaid Other | Admitting: Obstetrics & Gynecology

## 2018-12-02 VITALS — BP 131/82 | HR 95 | Ht 69.0 in | Wt 150.0 lb

## 2018-12-02 DIAGNOSIS — Z98891 History of uterine scar from previous surgery: Secondary | ICD-10-CM

## 2018-12-02 NOTE — Progress Notes (Signed)
  HPI: Patient returns for routine postoperative follow-up having undergone primary C section  on 11/25/2018.  The patient's immediate postoperative recovery has been unremarkable. Since hospital discharge the patient reports no problems.   Current Outpatient Medications: buprenorphine (SUBUTEX) 2 MG SUBL SL tablet, Place 2 mg under the tongue 3 (three) times daily., Disp: , Rfl:  Prenatal Vit-Fe Fumarate-FA (PRENATAL VITAMIN) 27-0.8 MG TABS, Take 1 tablet by mouth daily., Disp: 30 tablet, Rfl: 3  No current facility-administered medications for this visit.     Blood pressure 131/82, pulse 95, height 5\' 9"  (1.753 m), weight 150 lb (68 kg), currently breastfeeding.  Physical Exam: Incision clean dry intact Steri strips in place  Diagnostic Tests:   Pathology:   Impression: S/p ceasarean section  Plan:   Follow up: 4  weeks  Desires OCP Breast feeding  Lazaro Arms, MD

## 2018-12-06 ENCOUNTER — Other Ambulatory Visit: Payer: Self-pay

## 2018-12-09 ENCOUNTER — Other Ambulatory Visit (HOSPITAL_COMMUNITY): Payer: Self-pay

## 2018-12-09 ENCOUNTER — Other Ambulatory Visit: Payer: Self-pay

## 2018-12-13 ENCOUNTER — Other Ambulatory Visit: Payer: Self-pay

## 2018-12-16 ENCOUNTER — Encounter: Payer: Self-pay | Admitting: Obstetrics and Gynecology

## 2018-12-16 ENCOUNTER — Other Ambulatory Visit: Payer: Self-pay

## 2018-12-29 ENCOUNTER — Encounter: Payer: Self-pay | Admitting: *Deleted

## 2018-12-30 ENCOUNTER — Encounter: Payer: Medicaid Other | Admitting: Obstetrics & Gynecology

## 2019-01-05 ENCOUNTER — Encounter: Payer: Self-pay | Admitting: *Deleted

## 2019-01-06 ENCOUNTER — Encounter: Payer: Medicaid Other | Admitting: Obstetrics & Gynecology

## 2019-01-09 ENCOUNTER — Ambulatory Visit (INDEPENDENT_AMBULATORY_CARE_PROVIDER_SITE_OTHER): Payer: Medicaid Other | Admitting: Obstetrics & Gynecology

## 2019-01-09 ENCOUNTER — Encounter: Payer: Self-pay | Admitting: Obstetrics & Gynecology

## 2019-01-09 ENCOUNTER — Other Ambulatory Visit: Payer: Self-pay

## 2019-01-09 DIAGNOSIS — Z1389 Encounter for screening for other disorder: Secondary | ICD-10-CM

## 2019-01-09 MED ORDER — DESOGESTREL-ETHINYL ESTRADIOL 0.15-30 MG-MCG PO TABS: 1.0000 | ORAL_TABLET | Freq: Every day | ORAL | 11 refills | Status: AC

## 2019-01-09 NOTE — Progress Notes (Signed)
Subjective:     Tracy Stevenson is a 38 y.o. female who presents for a postpartum visit. She is 7 weeks postpartum following a low cervical transverse Cesarean section. I have fully reviewed the prenatal and intrapartum course. The delivery was at 39 gestational weeks. Outcome: primary cesarean section, low transverse incision. Anesthesia: epidural. Postpartum course has been normal. Baby's course has been normal. Baby is feeding by bottle. Bleeding on menses. Bowel function is normal. Bladder function is normal. Patient is not sexually active. Contraception method is none. Postpartum depression screening: negative.  The following portions of the patient's history were reviewed and updated as appropriate: allergies, current medications, past family history, past medical history, past social history, past surgical history and problem list.  Review of Systems Pertinent items are noted in HPI.   Objective:    BP (!) 114/58 (BP Location: Left Arm, Patient Position: Sitting, Cuff Size: Normal)   Pulse 63   Temp 99 F (37.2 C)   Ht 5\' 9"  (1.753 m)   Wt 157 lb 8 oz (71.4 kg)   LMP 01/06/2019   Breastfeeding No   BMI 23.26 kg/m   General:  alert, cooperative and no distress   Breasts:  n/a  Lungs:   Heart:    Abdomen: soft, non-tender; bowel sounds normal; no masses,  no organomegaly   Vulva:  normal  Vagina: normal vagina, no discharge, exudate, lesion, or erythema  Cervix:  absent  Corpus: normal size, contour, position, consistency, mobility, non-tender  Adnexa:  normal adnexa  Rectal Exam:         Assessment:     normal postpartum exam. Pap smear not done at today's visit.   Plan:    1. Contraception: OCP (estrogen/progesterone) 2.  Meds ordered this encounter  Medications  . desogestrel-ethinyl estradiol (APRI) 0.15-30 MG-MCG tablet    Sig: Take 1 tablet by mouth daily.    Dispense:  1 Package    Refill:  11    3. Follow up in: 6 months or as needed.

## 2020-06-30 IMAGING — US US MFM FETAL BPP WO NON STRESS
1 series · 12 of 24 positions shown · non-contrast
Comparison: none

[Series 1: us mfm fetal bpp wo non stress · 24 acquisitions, 12 frames shown]
[im 2/24]
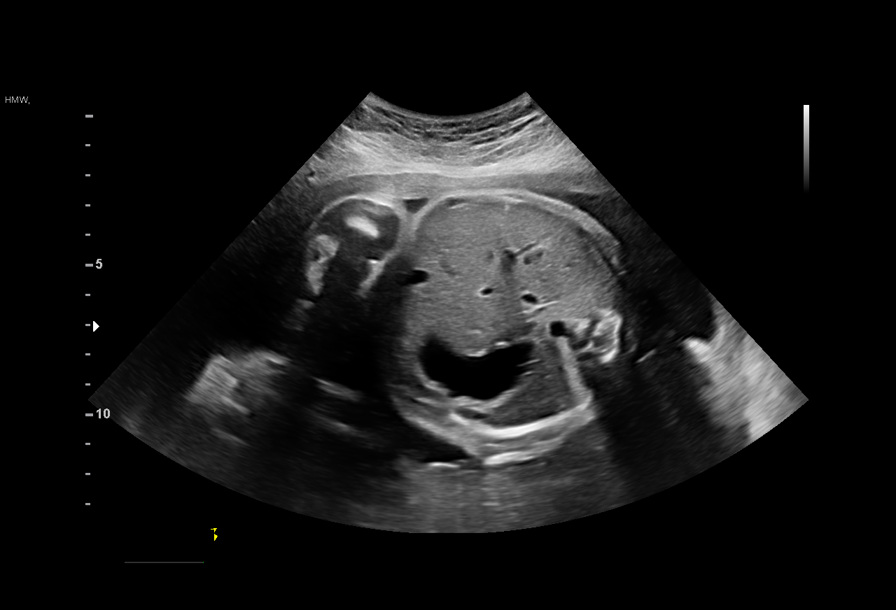
[im 4/24]
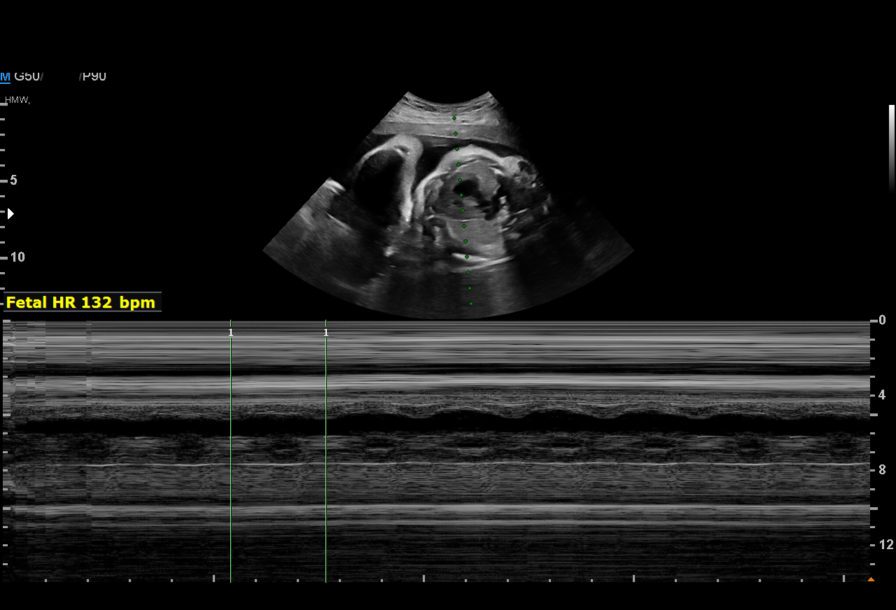
[im 6/24]
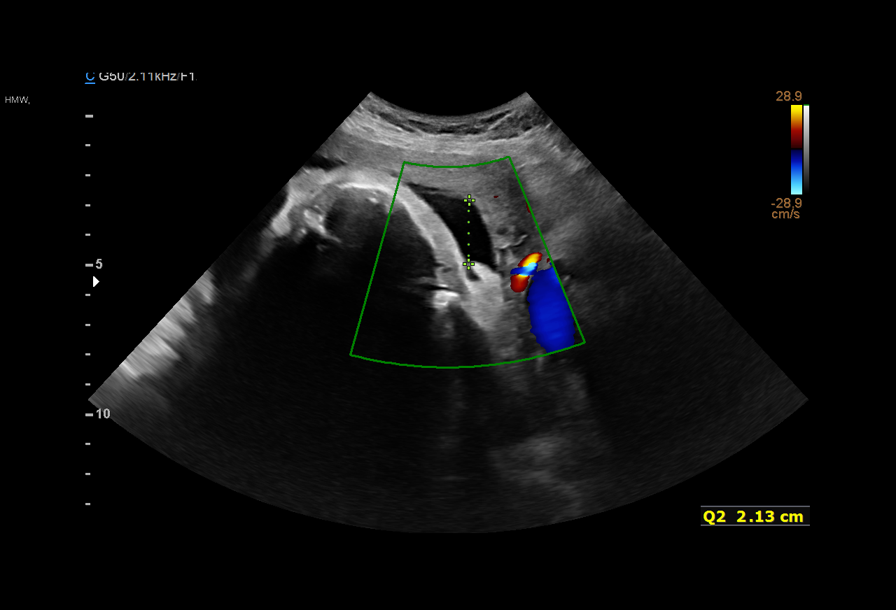
[im 8/24]
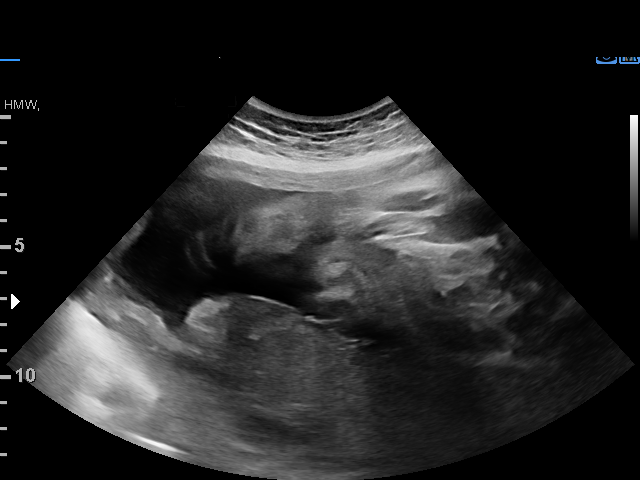
[im 10/24]
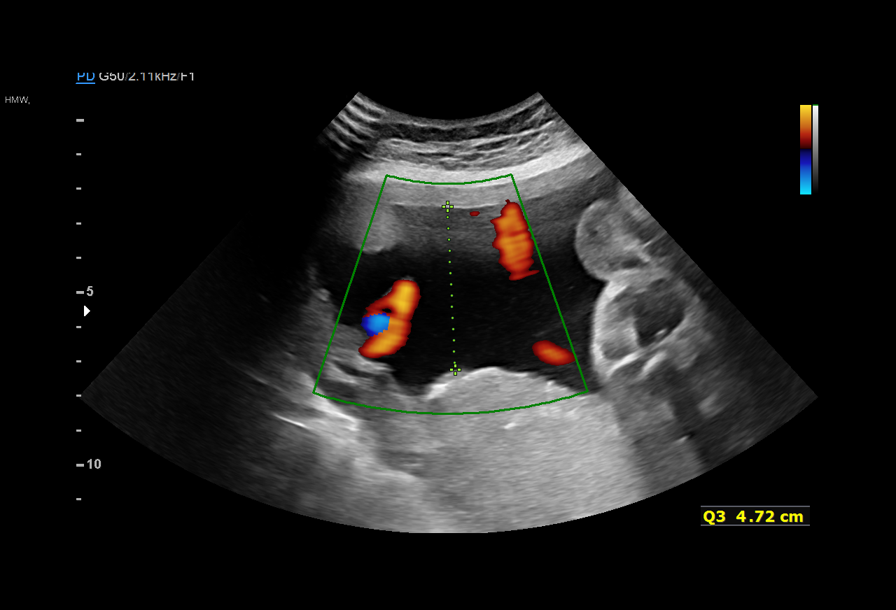
[im 12/24]
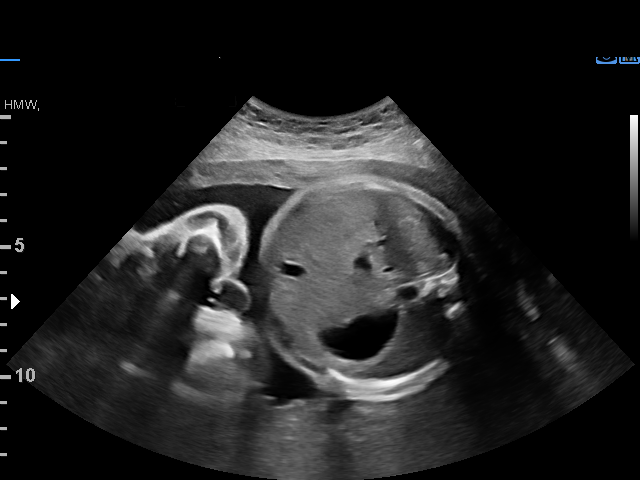
[im 14/24]
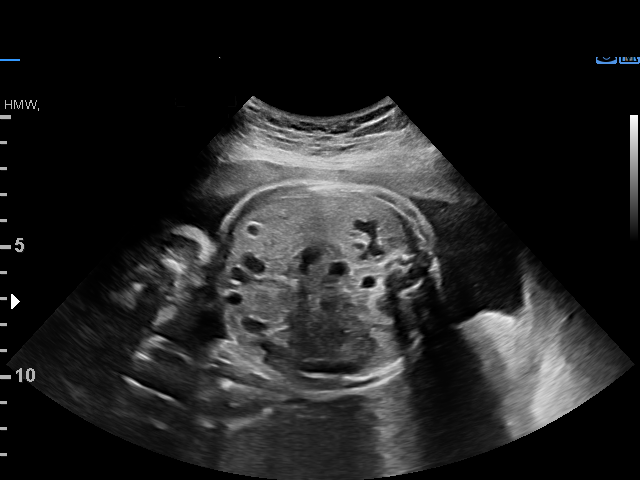
[im 16/24]
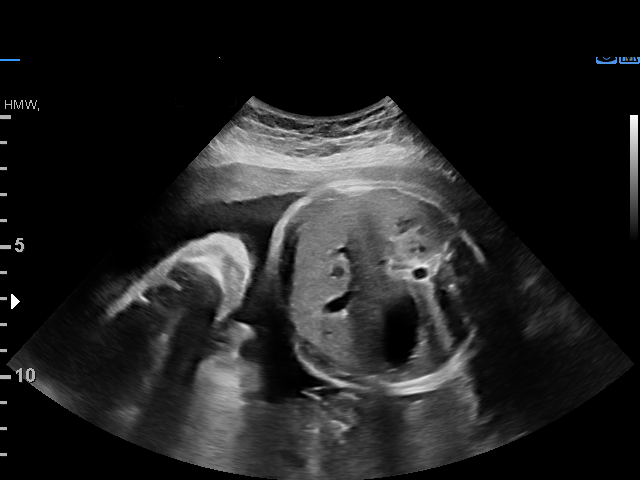
[im 18/24]
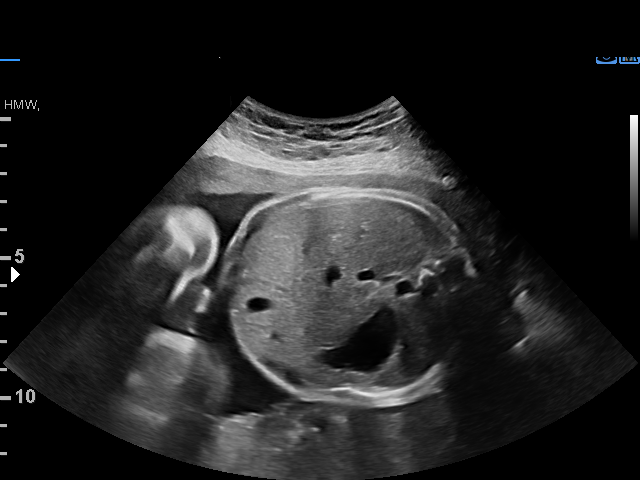
[im 20/24]
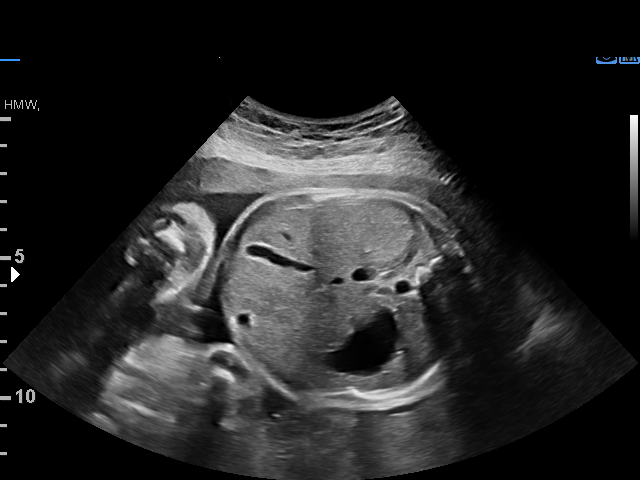
[im 22/24]
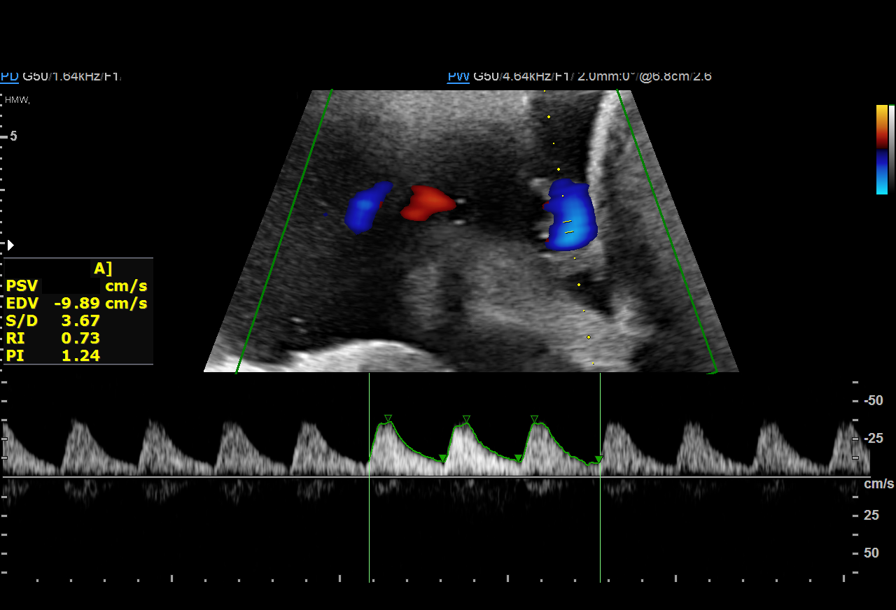
[im 24/24]
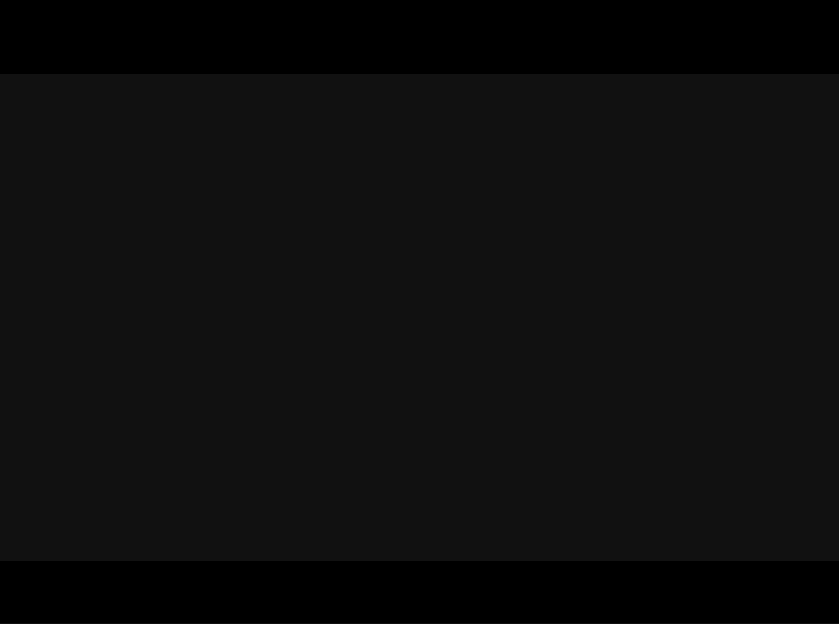

[12 of 24 positions shown; findings below may reference images not displayed]

1  US MFM UA CORD DOPPLER               76820.02     DIONESIUS VICKY MULYADI
 ----------------------------------------------------------------------

 ----------------------------------------------------------------------
Indications

  34 weeks gestation of pregnancy
  Drug use complicating pregnancy, third
  trimester (subutex)
  Maternal care for known or suspected poor
  fetal growth, third trimester, not applicable or
  unspecified
 ----------------------------------------------------------------------
Fetal Evaluation

 Num Of Fetuses:         1
 Fetal Heart Rate(bpm):  132
 Cardiac Activity:       Observed
 Presentation:           Cephalic

 Amniotic Fluid
 AFI FV:      Within normal limits

 AFI Sum(cm)     %Tile       Largest Pocket(cm)
 15.07           54

 RUQ(cm)       RLQ(cm)       LUQ(cm)        LLQ(cm)

Biophysical Evaluation

 Amniotic F.V:   Within normal limits       F. Tone:        Observed
 F. Movement:    Observed                   Score:          [DATE]
 F. Breathing:   Observed
OB History

 Gravidity:    2         Term:   1        Prem:   0        SAB:   0
 TOP:          0       Ectopic:  0        Living: 1
Gestational Age

 Best:          34w 5d     Det. By:  Early Ultrasound         EDD:   12/18/18
                                     (06/16/18)
Anatomy

 Thoracic:              Appears normal         Kidneys:                Appear normal
 Stomach:               Appears normal, left   Bladder:                Appears normal
                        sided
Doppler - Fetal Vessels

 Umbilical Artery
  S/D     %tile     RI              PI                     ADFV    RDFV
 4.19    > 97.5  0.74              1.3                         N       N

Impression

 and umbilical artery Doppler study. On your office ultrasound
 performed last week, the estimated fetal weight was at the
 10th percentile. Patient is having weekly NST and BPP
 (alternating).
 On today's ultrasound, amniotic fluid is normal and good fetal
 activity is seen. Antenatal testing is reassuring. BPP [DATE].
 Umbilical artery Doppler showed increased pulsatality index.
 I explained abnormal Doppler findings. If follow-up Doppler
 remains abnormal on her next visit at your office, I
 recommend delivery at 37 weeks.
Recommendations

 -Follow-up antenatal testing at your office.
 -Discuss with MFM on timing of delivery after next BPP and
 Doppler at your office.
                 Tiger, Dleke
